# Patient Record
Sex: Female | Born: 1950 | Race: White | Hispanic: No | Marital: Married | Smoking: Never smoker
Health system: Southern US, Community
[De-identification: ages and names within clinical notes are randomized; demographics above are authoritative.]

## PROBLEM LIST (undated history)

## (undated) DIAGNOSIS — Z87442 Personal history of urinary calculi: Secondary | ICD-10-CM

## (undated) DIAGNOSIS — T8859XA Other complications of anesthesia, initial encounter: Secondary | ICD-10-CM

## (undated) DIAGNOSIS — E162 Hypoglycemia, unspecified: Secondary | ICD-10-CM

## (undated) DIAGNOSIS — Z9889 Other specified postprocedural states: Secondary | ICD-10-CM

## (undated) DIAGNOSIS — K219 Gastro-esophageal reflux disease without esophagitis: Secondary | ICD-10-CM

## (undated) DIAGNOSIS — R519 Headache, unspecified: Secondary | ICD-10-CM

## (undated) DIAGNOSIS — D649 Anemia, unspecified: Secondary | ICD-10-CM

## (undated) DIAGNOSIS — C4490 Unspecified malignant neoplasm of skin, unspecified: Secondary | ICD-10-CM

## (undated) DIAGNOSIS — I341 Nonrheumatic mitral (valve) prolapse: Secondary | ICD-10-CM

## (undated) DIAGNOSIS — E785 Hyperlipidemia, unspecified: Secondary | ICD-10-CM

## (undated) DIAGNOSIS — T4145XA Adverse effect of unspecified anesthetic, initial encounter: Secondary | ICD-10-CM

## (undated) DIAGNOSIS — F329 Major depressive disorder, single episode, unspecified: Secondary | ICD-10-CM

## (undated) DIAGNOSIS — N189 Chronic kidney disease, unspecified: Secondary | ICD-10-CM

## (undated) DIAGNOSIS — M1990 Unspecified osteoarthritis, unspecified site: Secondary | ICD-10-CM

## (undated) DIAGNOSIS — R51 Headache: Secondary | ICD-10-CM

## (undated) DIAGNOSIS — F32A Depression, unspecified: Secondary | ICD-10-CM

## (undated) DIAGNOSIS — F419 Anxiety disorder, unspecified: Secondary | ICD-10-CM

## (undated) HISTORY — PX: CARPAL TUNNEL RELEASE: SHX101

## (undated) HISTORY — PX: SKIN TAG REMOVAL: SHX780

## (undated) HISTORY — PX: BUNIONECTOMY: SHX129

## (undated) HISTORY — PX: BELPHAROPTOSIS REPAIR: SHX369

---

## 1974-06-09 HISTORY — PX: BREAST REDUCTION SURGERY: SHX8

## 1998-02-28 ENCOUNTER — Ambulatory Visit (HOSPITAL_COMMUNITY): Admission: RE | Admit: 1998-02-28 | Discharge: 1998-02-28 | Payer: Self-pay | Admitting: Urology

## 2000-04-02 ENCOUNTER — Encounter: Admission: RE | Admit: 2000-04-02 | Discharge: 2000-04-02 | Payer: Self-pay | Admitting: Urology

## 2000-04-02 ENCOUNTER — Encounter: Payer: Self-pay | Admitting: Urology

## 2000-04-10 ENCOUNTER — Other Ambulatory Visit: Admission: RE | Admit: 2000-04-10 | Discharge: 2000-04-10 | Payer: Self-pay | Admitting: *Deleted

## 2000-08-19 ENCOUNTER — Encounter: Payer: Self-pay | Admitting: Emergency Medicine

## 2000-08-19 ENCOUNTER — Emergency Department (HOSPITAL_COMMUNITY): Admission: EM | Admit: 2000-08-19 | Discharge: 2000-08-19 | Payer: Self-pay | Admitting: Emergency Medicine

## 2001-05-28 ENCOUNTER — Encounter: Payer: Self-pay | Admitting: *Deleted

## 2001-05-28 ENCOUNTER — Encounter: Admission: RE | Admit: 2001-05-28 | Discharge: 2001-05-28 | Payer: Self-pay | Admitting: *Deleted

## 2002-05-03 ENCOUNTER — Encounter: Admission: RE | Admit: 2002-05-03 | Discharge: 2002-08-01 | Payer: Self-pay | Admitting: Family Medicine

## 2002-06-20 ENCOUNTER — Other Ambulatory Visit: Admission: RE | Admit: 2002-06-20 | Discharge: 2002-06-20 | Payer: Self-pay | Admitting: *Deleted

## 2002-11-15 ENCOUNTER — Encounter: Admission: RE | Admit: 2002-11-15 | Discharge: 2002-11-15 | Payer: Self-pay | Admitting: Orthopaedic Surgery

## 2002-11-15 ENCOUNTER — Encounter: Payer: Self-pay | Admitting: Orthopaedic Surgery

## 2002-11-15 ENCOUNTER — Encounter: Payer: Self-pay | Admitting: Diagnostic Radiology

## 2002-11-30 ENCOUNTER — Encounter: Admission: RE | Admit: 2002-11-30 | Discharge: 2002-11-30 | Payer: Self-pay | Admitting: Orthopaedic Surgery

## 2002-11-30 ENCOUNTER — Encounter: Payer: Self-pay | Admitting: Orthopaedic Surgery

## 2003-03-02 ENCOUNTER — Ambulatory Visit (HOSPITAL_COMMUNITY): Admission: RE | Admit: 2003-03-02 | Discharge: 2003-03-02 | Payer: Self-pay | Admitting: Urology

## 2003-03-03 ENCOUNTER — Encounter: Payer: Self-pay | Admitting: Emergency Medicine

## 2003-03-03 ENCOUNTER — Emergency Department (HOSPITAL_COMMUNITY): Admission: EM | Admit: 2003-03-03 | Discharge: 2003-03-04 | Payer: Self-pay | Admitting: Emergency Medicine

## 2005-06-05 ENCOUNTER — Ambulatory Visit (HOSPITAL_COMMUNITY): Admission: RE | Admit: 2005-06-05 | Discharge: 2005-06-05 | Payer: Self-pay | Admitting: Chiropractic Medicine

## 2005-07-14 ENCOUNTER — Encounter: Admission: RE | Admit: 2005-07-14 | Discharge: 2005-07-14 | Payer: Self-pay | Admitting: Orthopaedic Surgery

## 2005-08-16 ENCOUNTER — Encounter: Admission: RE | Admit: 2005-08-16 | Discharge: 2005-08-16 | Payer: Self-pay | Admitting: Orthopaedic Surgery

## 2005-10-08 ENCOUNTER — Encounter: Payer: Self-pay | Admitting: Urology

## 2007-06-02 ENCOUNTER — Ambulatory Visit (HOSPITAL_BASED_OUTPATIENT_CLINIC_OR_DEPARTMENT_OTHER): Admission: RE | Admit: 2007-06-02 | Discharge: 2007-06-02 | Payer: Self-pay | Admitting: Orthopedic Surgery

## 2007-12-19 ENCOUNTER — Emergency Department (HOSPITAL_COMMUNITY): Admission: EM | Admit: 2007-12-19 | Discharge: 2007-12-19 | Payer: Self-pay | Admitting: Emergency Medicine

## 2008-01-13 ENCOUNTER — Ambulatory Visit (HOSPITAL_COMMUNITY): Admission: RE | Admit: 2008-01-13 | Discharge: 2008-01-13 | Payer: Self-pay | Admitting: Urology

## 2009-05-31 ENCOUNTER — Encounter (INDEPENDENT_AMBULATORY_CARE_PROVIDER_SITE_OTHER): Payer: Self-pay | Admitting: *Deleted

## 2009-06-09 HISTORY — PX: SQUAMOUS CELL CARCINOMA EXCISION: SHX2433

## 2009-08-24 ENCOUNTER — Telehealth: Payer: Self-pay | Admitting: Internal Medicine

## 2009-09-19 ENCOUNTER — Encounter (INDEPENDENT_AMBULATORY_CARE_PROVIDER_SITE_OTHER): Payer: Self-pay | Admitting: *Deleted

## 2009-10-05 ENCOUNTER — Encounter (INDEPENDENT_AMBULATORY_CARE_PROVIDER_SITE_OTHER): Payer: Self-pay | Admitting: *Deleted

## 2009-10-10 ENCOUNTER — Ambulatory Visit: Payer: Self-pay | Admitting: Internal Medicine

## 2010-04-01 ENCOUNTER — Ambulatory Visit: Payer: Self-pay | Admitting: Cardiology

## 2010-07-09 NOTE — Letter (Signed)
Summary: [facility] Instructions  [facility]  [address] [place], [place] [address]   Phone: [phone]  Fax: [phone]       Mary Harrell    02-04-1951    MRN: [id number]        Procedure Day Dorna Bloom:  [facility] 12/04/09     Arrival Time:  8:00AM     Procedure Time:  9:00AM     Location of Procedure:                    Juliann Pares  [facility] (4th Floor)                        PREPARATION FOR COLONOSCOPY WITH MOVIPREP   Starting 5 days prior to your procedure 11/29/09 do not eat nuts, seeds, popcorn, corn, beans, peas,  salads, or any raw vegetables.  Do not take any fiber supplements (e.g. Metamucil, Citrucel, and Benefiber).  THE DAY BEFORE YOUR PROCEDURE         DATE: 12/03/09  DAY: MONDAY  1.  Drink clear liquids the entire day-NO SOLID FOOD  2.  Do not drink anything colored red or purple.  Avoid juices with pulp.  No orange juice.  3.  Drink at least 64 oz. (8 glasses) of fluid/clear liquids during the day to prevent dehydration and help the prep work efficiently.  CLEAR LIQUIDS INCLUDE: Water Jello Ice Popsicles Tea (sugar ok, no milk/cream) Powdered fruit flavored drinks Coffee (sugar ok, no milk/cream) Gatorade Juice: apple, white grape, white cranberry  Lemonade Clear bullion, consomm, broth Carbonated beverages (any kind) Strained chicken noodle soup Hard Candy                             4.  In the morning, mix first dose of MoviPrep solution:    Empty 1 Pouch A and 1 Pouch B into the disposable container    Add lukewarm drinking water to the top line of the container. Mix to dissolve    Refrigerate (mixed solution should be used within 24 hrs)  5.  Begin drinking the prep at 5:00 p.m. The MoviPrep container is divided by 4 marks.   Every 15 minutes drink the solution down to the next mark (approximately 8 oz) until the full liter is complete.   6.  Follow completed prep with 16 oz of clear liquid of your choice (Nothing  red or purple).  Continue to drink clear liquids until bedtime.  7.  Before going to bed, mix second dose of MoviPrep solution:    Empty 1 Pouch A and 1 Pouch B into the disposable container    Add lukewarm drinking water to the top line of the container. Mix to dissolve    Refrigerate  THE DAY OF YOUR PROCEDURE      DATE: 12/04/09  DAY: TUESDAY  Beginning at 4:00AM (5 hours before procedure):         1. Every 15 minutes, drink the solution down to the next mark (approx 8 oz) until the full liter is complete.  2. Follow completed prep with 16 oz. of clear liquid of your choice.    3. You may drink clear liquids until7:00AM (2 HOURS BEFORE PROCEDURE).   MEDICATION INSTRUCTIONS  Unless otherwise instructed, you should take regular prescription medications with a small sip of water   as early as possible the morning of your  procedure.         OTHER INSTRUCTIONS  You will need a responsible adult at least 62 years of age to accompany you and drive you home.   This person must remain in the waiting room during your procedure.  Wear loose fitting clothing that is easily removed.  Leave jewelry and other valuables at home.  However, you may wish to bring a book to read or  an iPod/MP3 player to listen to music as you wait for your procedure to start.  Remove all body piercing jewelry and leave at home.  Total time from sign-in until discharge is approximately 2-3 hours.  You should go home directly after your procedure and rest.  You can resume normal activities the  day after your procedure.  The day of your procedure you should not:   Drive   Make legal decisions   Operate machinery   Drink alcohol   Return to work  You will receive specific instructions about eating, activities and medications before you leave.    The above instructions have been reviewed and explained to me by  Wyona Almas RN  Oct 10, 2009 8:31 AM     I fully understand and can  verbalize these instructions _____________________________ Date _________   Appended Document: Moviprep Instructions Procedure day is on Tuesday 12/04/09

## 2010-07-09 NOTE — Progress Notes (Signed)
Summary: Schedule Colonoscopy  Phone Note Outgoing Call Call back at [facility] Phone [phone] Call back at [phone]   Call placed by: Harlow Mares CMA Duncan Dull),  August 24, 2009 9:14 AM Call placed to: Patient Summary of Call: Patient due for colonsocopy, patient wants to check with her primary care MD before she schedules her colonoscopy.  Initial call taken by: Harlow Mares CMA Duncan Dull),  August 24, 2009 9:17 AM

## 2010-07-09 NOTE — Letter (Signed)
Summary: Previsit letter  [facility]  [address] [place], [place] [address]   Phone: [phone]  Fax: [phone]       09/19/2009 MRN: [id number]  Mary Harrell [address] [place], [place]  [address]  [place]  Dear Ms. Cumberledge,  Welcome to the Gastroenterology Division at [facility]    You are scheduled to see a nurse for your pre-procedure visit on 10/10/2009 at 8:00AM on the 3rd floor at [facility], [address]  We ask that you try to arrive at our office 15 minutes prior to your appointment time to allow for check-in.  Your nurse visit will consist of discussing your medical and surgical history, your immediate family medical history, and your medications.    Please bring a complete list of all your medications or, if you prefer, bring the medication bottles and we will list them.  We will need to be aware of both prescribed and over the counter drugs.  We will need to know exact dosage information as well.  If you are on blood thinners (Coumadin, Plavix, Aggrenox, Ticlid, etc.) please call our office today/prior to your appointment, as we need to consult with your physician about holding your medication.   Please be prepared to read and sign documents such as consent forms, a financial agreement, and acknowledgement forms.  If necessary, and with your consent, a friend or relative is welcome to sit-in on the nurse visit with you.  Please bring your insurance card so that we may make a copy of it.  If your insurance requires a referral to see a specialist, please bring your referral form from your primary care physician.  No co-pay is required for this nurse visit.     If you cannot keep your appointment, please call [phone] to cancel or reschedule prior to your appointment date.  This allows [place] the opportunity to schedule an appointment for another patient in need of care.    Thank you for choosing [facility] for your medical needs.   We appreciate the opportunity to care for you.  Please visit [place] at our website  to learn more about our practice.                     Sincerely.                                                                                                                   The Gastroenterology Division

## 2010-07-09 NOTE — Miscellaneous (Signed)
Summary: [facility] PREVISIT/PREP  Clinical Lists Changes  Medications: Added new medication of MOVIPREP 100 GM  SOLR (PEG-KCL-NACL-NASULF-NA ASC-C) As per prep instructions. - Signed Rx of MOVIPREP 100 GM  SOLR (PEG-KCL-NACL-NASULF-NA ASC-C) As per prep instructions.;  #1 x 0;  Signed;  Entered by: Wyona Almas RN;  Authorized by: Hilarie Fredrickson MD;  Method used: Electronically to [id number] [address], [place], [place]  [address], Ph: [phone] or [phone], Fax: [phone] Allergies: Added new allergy or adverse reaction of MORPHINE Observations: Added new observation of NKA: F (10/10/2009 7:46)    Prescriptions: MOVIPREP 100 GM  SOLR (PEG-KCL-NACL-NASULF-NA ASC-C) As per prep instructions.  #1 x 0   Entered by:   Wyona Almas RN   Authorized by:   Hilarie Fredrickson MD   Signed by:   Wyona Almas RN on 10/10/2009   Method used:   Electronically to        [id number] (retail)       [address]       [place], [place]  [address]       Ph: [phone] or [phone]       Fax: [phone]   RxID:   [id number]

## 2010-07-16 ENCOUNTER — Other Ambulatory Visit (INDEPENDENT_AMBULATORY_CARE_PROVIDER_SITE_OTHER): Payer: 59

## 2010-07-16 DIAGNOSIS — R0989 Other specified symptoms and signs involving the circulatory and respiratory systems: Secondary | ICD-10-CM

## 2010-10-14 ENCOUNTER — Other Ambulatory Visit: Payer: Self-pay | Admitting: Family Medicine

## 2010-10-14 DIAGNOSIS — M5106 Intervertebral disc disorders with myelopathy, lumbar region: Secondary | ICD-10-CM

## 2010-10-19 ENCOUNTER — Ambulatory Visit
Admission: RE | Admit: 2010-10-19 | Discharge: 2010-10-19 | Disposition: A | Discharge status: Home / Self Care | Payer: 59 | Source: Ambulatory Visit | Attending: Family Medicine | Admitting: Family Medicine

## 2010-10-19 DIAGNOSIS — M5106 Intervertebral disc disorders with myelopathy, lumbar region: Secondary | ICD-10-CM

## 2010-10-22 NOTE — Op Note (Signed)
NAME:  Mary Harrell, Mary Harrell                  ACCOUNT NO.:  [id number]   MEDICAL RECORD NO.:  [id number]          PATIENT TYPE:  AMB   LOCATION:  [facility]                          FACILITY:  [facility]   PHYSICIAN:  Matthew A. Weingold, M.D.DATE OF BIRTH:  13-Apr-1951   DATE OF PROCEDURE:  06/02/2007  DATE OF DISCHARGE:                               OPERATIVE REPORT   PREOPERATIVE DIAGNOSIS:  Chronic right carpal tunnel syndrome.   POSTOPERATIVE DIAGNOSIS:  Chronic right carpal tunnel syndrome.   PROCEDURE:  Right carpal tunnel release.   SURGEON:  Artist Pais. Mina Marble, M.D.   ASSISTANT:  None.   ANESTHESIA:  Regional.   TOURNIQUET TIME:  25 minutes.   COMPLICATIONS:  None.   DRAINS:  None.   OPERATIVE REPORT:  The patient taken to the operating suite.  After the  induction of adequate regional anesthetic, the right upper extremity was  prepped and draped in the usual sterile fashion.  Once this was done a 2-  cm incision was made in the palmar aspect of the right hand in line with  the long finger metacarpal starting at Kaplan's cardinal line.  The skin  was incised.  The palmar fascia was identified and split.  The distal  edge of the transverse carpal ligament was identified and split with a  15 blade.  The median nerve was identified, protected with a Market researcher.  The remaining aspect of the transverse carpal ligament then  divided under direct vision using a curved blunt scissors.  The canal  was inspected.  There were osseous lesions or ganglions present.  It was  irrigated and loosely closed with a 3-0 Prolene subcuticular stitch.  Steri-Strips, 4x4s, fluffs and a compressive dressing were applied.  The  patient tolerated the procedure well and went to the recovery room in  stable fashion.      Artist Pais Mina Marble, M.D.  Electronically Signed     MAW/MEDQ  D:  06/02/2007  T:  06/02/2007  Job:  [id number]

## 2011-02-04 ENCOUNTER — Ambulatory Visit
Admission: RE | Admit: 2011-02-04 | Discharge: 2011-02-04 | Disposition: A | Discharge status: Home / Self Care | Payer: 59 | Source: Ambulatory Visit | Attending: Neurosurgery | Admitting: Neurosurgery

## 2011-02-04 ENCOUNTER — Other Ambulatory Visit: Payer: Self-pay | Admitting: Neurosurgery

## 2011-02-04 DIAGNOSIS — M25559 Pain in unspecified hip: Secondary | ICD-10-CM

## 2011-02-04 DIAGNOSIS — M545 Low back pain: Secondary | ICD-10-CM

## 2011-03-06 LAB — POCT I-STAT, CHEM 8
Chloride: 109
Creatinine, Ser: 1
Glucose, Bld: 151 — ABNORMAL HIGH
Potassium: 3.5

## 2011-03-06 LAB — URINALYSIS, ROUTINE W REFLEX MICROSCOPIC
Bilirubin Urine: NEGATIVE
Nitrite: NEGATIVE
Protein, ur: 30 — AB
Urobilinogen, UA: 0.2

## 2011-03-07 LAB — URINALYSIS, ROUTINE W REFLEX MICROSCOPIC
Bilirubin Urine: NEGATIVE
Ketones, ur: NEGATIVE
Protein, ur: NEGATIVE
Urobilinogen, UA: 0.2

## 2011-03-07 LAB — CBC
HCT: 43.4
Hemoglobin: 15
MCHC: 34.6
MCV: 92
RDW: 12.2

## 2011-03-07 LAB — URINE MICROSCOPIC-ADD ON

## 2011-03-07 LAB — BASIC METABOLIC PANEL
CO2: 29
Calcium: 9.2
Chloride: 104
GFR calc Af Amer: >60
Sodium: 140

## 2011-03-14 LAB — POCT HEMOGLOBIN-HEMACUE
Hemoglobin: 14.1
Operator id: 128471

## 2011-05-28 ENCOUNTER — Ambulatory Visit: Payer: 59

## 2011-05-28 DIAGNOSIS — Z006 Encounter for examination for normal comparison and control in clinical research program: Secondary | ICD-10-CM

## 2013-10-13 ENCOUNTER — Other Ambulatory Visit: Payer: Self-pay | Admitting: Family Medicine

## 2013-10-13 DIAGNOSIS — Z8249 Family history of ischemic heart disease and other diseases of the circulatory system: Secondary | ICD-10-CM

## 2013-11-06 ENCOUNTER — Emergency Department (INDEPENDENT_AMBULATORY_CARE_PROVIDER_SITE_OTHER)
Admission: EM | Admit: 2013-11-06 | Discharge: 2013-11-06 | Disposition: A | Discharge status: Home / Self Care | Payer: 59 | Source: Home / Self Care | Attending: Family Medicine | Admitting: Family Medicine

## 2013-11-06 ENCOUNTER — Encounter (HOSPITAL_COMMUNITY): Payer: Self-pay | Admitting: Emergency Medicine

## 2013-11-06 ENCOUNTER — Emergency Department (INDEPENDENT_AMBULATORY_CARE_PROVIDER_SITE_OTHER): Payer: 59

## 2013-11-06 DIAGNOSIS — M7020 Olecranon bursitis, unspecified elbow: Secondary | ICD-10-CM

## 2013-11-06 DIAGNOSIS — M7022 Olecranon bursitis, left elbow: Secondary | ICD-10-CM

## 2013-11-06 MED ORDER — DICLOFENAC SODIUM 50 MG PO TBEC: 50.0000 mg | DELAYED_RELEASE_TABLET | Freq: Two times a day (BID) | ORAL | Status: DC | PRN

## 2013-11-06 NOTE — ED Provider Notes (Signed)
Mary Harrell is a 63 y.o. female who presents to Urgent Care today for left elbow pain and swelling. Patient hit her elbow on a firm object about 24 hours ago. She notes swelling of the olecranon starting this morning. She notes mild pain. No fevers or chills nausea vomiting or diarrhea. She's tried ice and elevation which helps. She currently taking prednisone for cervical radiculopathy.   History reviewed. No pertinent past medical history. History  Substance Use Topics  . Smoking status: Not on file  . Smokeless tobacco: Not on file  . Alcohol Use: Not on file   ROS as above Medications: No current facility-administered medications for this encounter.   Current Outpatient Prescriptions  Medication Sig Dispense Refill  . diclofenac (VOLTAREN) 50 MG EC tablet Take 1 tablet (50 mg total) by mouth 2 (two) times daily as needed.  60 tablet  0    Exam:  BP 143/79  Pulse 65  Temp(Src) 98.1 F (36.7 C) (Oral)  Resp 18  SpO2 100% Gen: Well NAD Left elbow ecchymosis and swelling of the olecranon. Nontender. Elbow motion is intact. Capillary refill sensation and pulses are intact distally. Hand motion is intact distally.  No results found for this or any previous visit (from the past 24 hour(s)). Dg Elbow Complete Left  11/06/2013   CLINICAL DATA:  Left elbow pain following injury  EXAM: LEFT ELBOW - COMPLETE 3+ VIEW  COMPARISON:  None.  FINDINGS: No acute fracture is identified. No joint effusion is seen. Mild degenerative changes of the articulation between the humerus and ulna are seen. No soft tissue changes are noted.  IMPRESSION: No acute abnormality seen.   Electronically Signed   By: Inez Catalina M.D.   On: 11/06/2013 13:53    Assessment and Plan: 63 y.o. female with traumatic olecranon bursitis left elbow. Ice rest elevation and Voltaren. Elbow pad.  Discussed warning signs or symptoms. Please see discharge instructions. Patient expresses understanding.    Gregor Hams,  MD 11/06/13 [phone]

## 2013-11-06 NOTE — Discharge Instructions (Signed)
Thank you for coming in today. Keep the elbow protected.  Use the pad as needed.    Olecranon Bursitis  with Rehab A bursa is a fluid filled sac that is located between soft tissues (ligaments, tendons, skin) and bones. The purpose of a bursa is to allow the soft tissue to function smoothly, without friction. The olecrenon bursa is located between the back of the elbow (olecrenon) and the skin. Olecrenon bursitis involves inflammation of this bursa, resulting in pain. SYMPTOMS   Pain, tenderness, swelling, warmth, or redness over the back of the elbow.  Reduced range of motion of the affected elbow.  Sometimes, severe pain with movement of the affected elbow.  Crackling sound (crepitation) when the bursa is moved or touched.  Often, painless swelling of the bursa.  Fever (when infected). CAUSES  Olecranon bursitis is often caused by direct hit (trauma) to the elbow. Less commonly, it is due to overuse and/or strenuous exercise that the elbow is not used to. RISK INCREASES WITH:  Sports that require bending or landing on the elbow (football, volleyball).  Vigorous or repetitive athletic training, or sudden increase or change in activity level (weekend warriors).  Failure to warm up properly before activity.  Poor exercise technique.  Playing on artificial turf. PREVENTION  Avoid injuries and the overuse of muscles whenever possible.  Warm up and stretch properly before activity.  Allow for adequate recovery between workouts.  Maintain physical fitness:  Strength, flexibility, and endurance.  Cardiovascular fitness.  Learn and use proper technique.  Wear properly fitted and padded protective equipment. PROGNOSIS  If treated properly, olecranon bursitis is usually curable within 2 weeks.  RELATED COMPLICATIONS   Longer healing time, if not properly treated or if not given enough time to heal.  Recurring symptoms that result in a chronic problem.  Joint  stiffness with permanent limitation of the affected joint's movement.  Infection of the bursa.  Chronic inflammation or scarring of the bursa. TREATMENT Treatment first involves the use of ice and medicine, to reduce pain and inflammation. The use of strengthening and stretching exercises may help reduce pain with activity. These exercises may be performed at home or with a therapist. Elbow pads may be advised, to protect the bursa. If symptoms persist, despite non-surgical treatment, a procedure to withdraw fluid from the bursa may be advised. This procedure may be accompanied with an injection of corticosteroids, to reduce inflammation. Sometimes, surgery is needed to remove the bursa. MEDICATION  If pain medicine is needed, nonsteroidal anti-inflammatory medicines (aspirin and ibuprofen), or other minor pain relievers (acetaminophen), are often advised.  Do not take pain medicine for 7 days before surgery.  Prescription pain relievers may be given, if your caregiver thinks they are needed. Use only as directed and only as much as you need.  Corticosteroid injections may be given by your caregiver. These injections should be reserved for the most serious cases, because they may only be given a certain number of times. HEAT AND COLD  Cold treatment (icing) should be applied for 10 to 15 minutes every 2 to 3 hours for inflammation and pain, and immediately after activity that aggravates your symptoms. Use ice packs or an ice massage.  Heat treatment may be used before performing stretching and strengthening activities prescribed by your caregiver, physical therapist, or athletic trainer. Use a heat pack or a warm water soak. SEEK IMMEDIATE MEDICAL CARE IF:   Symptoms get worse or do not improve in 2 weeks, despite treatment.  Signs of infection develop, including fever of 102 F (38.9 C), increased pain, redness, warmth, or pus draining from the bursa.  New, unexplained symptoms develop.  (Drugs used in treatment may produce side effects.) EXERCISES  RANGE OF MOTION (ROM) AND STRETCHING EXERCISES - Olecranon Bursitis These exercises may help you when beginning to rehabilitate your injury. Your symptoms may resolve with or without further involvement from your physician, physical therapist or athletic trainer. While completing these exercises, remember:   Restoring tissue flexibility helps normal motion to return to the joints. This allows healthier, less painful movement and activity.  An effective stretch should be held for at least 30 seconds.  A stretch should never be painful. You should only feel a gentle lengthening or release in the stretched tissue. RANGE OF MOTION  Elbow Flexion, Supine  Lie on your back. Extend your right / left arm into the air, bracing it with your opposite hand. Allow your right / left arm to relax.  Let your elbow bend, allowing your hand to fall slowly toward your chest.  You should feel a gentle stretch along the back of your upper arm and elbow. Your physician, physical therapist or athletic trainer may ask you to hold a __________ hand weight to increase the intensity of this stretch.  Hold for __________ seconds. Slowly return your right / left arm to the upright position. Repeat __________ times. Complete this exercise __________ times per day. STRETCH  Elbow Flexors  Lie on a firm bed or countertop on your back. Be sure that you are in a comfortable position which will allow you to relax your arm muscles.  Place a folded towel under your right / left upper arm, so that your elbow and shoulder are at the same height. Extend your arm; your elbow should not rest on the bed or towel  Allow the weight of your hand to straighten your elbow. Keep your arm and chest muscles relaxed. Your caregiver may ask you to increase the intensity of your stretch by adding a small wrist or hand weight.  Hold for __________ seconds. You should feel a  stretch on the inside of your elbow. Slowly return to the starting position. Repeat __________ times. Complete this exercise __________ times per day. STRENGTHENING EXERCISES - Olecranon Bursitis These exercises will help you regain your strength. They may resolve your symptoms with or without further involvement from your physician, physical therapist or athletic trainer. While completing these exercises, remember:   Muscles can gain both the endurance and the strength needed for everyday activities through controlled exercises.  Complete these exercises as instructed by your physician, physical therapist or athletic trainer. Increase the resistance and repetitions only as guided by your caregiver.  You may experience muscle soreness or fatigue, but the pain or discomfort you are trying to eliminate should never worsen during these exercises. If this pain does worsen, stop and make certain you are following the directions exactly. If the pain is still present after adjustments, discontinue the exercise until you can discuss the trouble with your caregiver. STRENGTH - Elbow Extensors, Isometric  Stand or sit upright on a firm surface. Place your right / left arm so that your palm faces your stomach, and it is at the height of your waist.  Place your opposite hand on the underside of your forearm. Gently push up as your right / left arm resists. Push as hard as you can with both arms without causing any pain or movement at your right /  left elbow. Hold this stationary position for __________ seconds.  Gradually release the tension in both arms. Allow your muscles to relax completely before repeating. Repeat __________ times. Complete this exercise __________ times per day. STRENGTH - Elbow Flexors, Isometric  Stand or sit upright on a firm surface. Place your right / left arm so that your hand is palm-up and at the height of your waist.  Place your opposite hand on top of your forearm. Gently  push down as your right / left arm resists. Push as hard as you can with both arms without causing any pain or movement at your right / left elbow. Hold this stationary position for __________ seconds.  Gradually release the tension in both arms. Allow your muscles to relax completely before repeating. Repeat __________ times. Complete this exercise __________ times per day. STRENGTH  Elbow Flexors, Supinated  With good posture, stand or sit on a firm chair without armrests. Allow your right / left arm to rest at your side with your palm facing forward.  Holding a __________ weight, or gripping a rubber exercise band or tubing,  bring your hand toward your shoulder.  Allow your muscles to control the resistance as your hand returns to your side. Repeat __________ times. Complete this exercise __________ times per day.  STRENGTH  Elbow Flexors, Neutral  With good posture, stand or sit on a firm chair without armrests. Allow your right / left arm to rest at your side with your thumb facing forward.  Holding a __________weight, or gripping a rubber exercise band or tubing,  bring your hand toward your shoulder.  Allow your muscles to control the resistance as your hand returns to your side. Repeat __________ times. Complete this exercise __________ times per day.  STRENGTH  Elbow Extensors  Lie on your back. Extend your right / left elbow into the air, pointing it toward the ceiling. Brace your arm with your opposite hand.*  Holding a __________ weight in your hand, slowly straighten your right / left elbow.  Allow your muscles to control the weight as your hand returns to its starting position. Repeat __________ times. Complete this exercise __________ times per day. *You may also stand with your elbow overhead and pointed toward the ceiling, supported by your opposite hand. STRENGTH - Elbow Extensors, Dynamic  With good posture, stand, or sit on a firm chair without armrests.  Keeping your upper arms at your side, bring both hands up to your right / left shoulder while gripping a rubber exercise band or tubing. Your right / left hand should be just below the other hand.  Straighten your right / left elbow. Hold for __________ seconds.  Allow your muscles to control the rubber exercise band, as your hand returns to your shoulder. Repeat __________ times. Complete this exercise __________ times per day. Document Released: 05/26/2005 Document Revised: 08/18/2011 Document Reviewed: 09/07/2008 [facility] Patient Information 2014 [place], [place].

## 2013-11-06 NOTE — ED Notes (Signed)
C/o elbow injury States she hit elbow on rail at target around 11 am yesterday morning States elbow is red and swollen Did ice and is currently on prednisone

## 2013-11-08 ENCOUNTER — Other Ambulatory Visit: Payer: 59

## 2013-11-09 ENCOUNTER — Other Ambulatory Visit: Payer: Self-pay | Admitting: Family Medicine

## 2013-11-09 ENCOUNTER — Ambulatory Visit
Admission: RE | Admit: 2013-11-09 | Discharge: 2013-11-09 | Disposition: A | Discharge status: Home / Self Care | Payer: 59 | Source: Ambulatory Visit | Attending: Family Medicine | Admitting: Family Medicine

## 2013-11-09 DIAGNOSIS — M25512 Pain in left shoulder: Secondary | ICD-10-CM

## 2013-11-09 DIAGNOSIS — Z8249 Family history of ischemic heart disease and other diseases of the circulatory system: Secondary | ICD-10-CM

## 2013-12-21 ENCOUNTER — Other Ambulatory Visit: Payer: Self-pay | Admitting: Family Medicine

## 2013-12-22 ENCOUNTER — Other Ambulatory Visit: Payer: Self-pay | Admitting: Family Medicine

## 2013-12-22 DIAGNOSIS — M25512 Pain in left shoulder: Secondary | ICD-10-CM

## 2013-12-28 ENCOUNTER — Ambulatory Visit
Admission: RE | Admit: 2013-12-28 | Discharge: 2013-12-28 | Disposition: A | Discharge status: Home / Self Care | Payer: 59 | Source: Ambulatory Visit | Attending: Family Medicine | Admitting: Family Medicine

## 2013-12-28 DIAGNOSIS — M25512 Pain in left shoulder: Secondary | ICD-10-CM

## 2014-01-26 ENCOUNTER — Other Ambulatory Visit: Payer: Self-pay | Admitting: Neurosurgery

## 2014-01-27 ENCOUNTER — Other Ambulatory Visit: Payer: Self-pay | Admitting: Neurosurgery

## 2014-01-27 DIAGNOSIS — M5010 Cervical disc disorder with radiculopathy, unspecified cervical region: Secondary | ICD-10-CM

## 2014-01-31 ENCOUNTER — Ambulatory Visit
Admission: RE | Admit: 2014-01-31 | Discharge: 2014-01-31 | Disposition: A | Discharge status: Home / Self Care | Payer: 59 | Source: Ambulatory Visit | Attending: Neurosurgery | Admitting: Neurosurgery

## 2014-01-31 VITALS — BP 121/70 | HR 72

## 2014-01-31 DIAGNOSIS — M5020 Other cervical disc displacement, unspecified cervical region: Secondary | ICD-10-CM

## 2014-01-31 DIAGNOSIS — M5010 Cervical disc disorder with radiculopathy, unspecified cervical region: Secondary | ICD-10-CM

## 2014-01-31 MED ORDER — IOHEXOL 300 MG/ML  SOLN
1.0000 mL | Freq: Once | INTRAMUSCULAR | Status: AC | PRN
  Administered 2014-01-31: 1 mL via EPIDURAL

## 2014-01-31 MED ORDER — TRIAMCINOLONE ACETONIDE 40 MG/ML IJ SUSP (RADIOLOGY)
60.0000 mg | Freq: Once | INTRAMUSCULAR | Status: AC
  Administered 2014-01-31: 60 mg via EPIDURAL

## 2014-01-31 NOTE — Discharge Instructions (Signed)

## 2014-05-25 ENCOUNTER — Other Ambulatory Visit: Payer: Self-pay | Admitting: Neurosurgery

## 2014-05-25 NOTE — Pre-Procedure Instructions (Signed)
Mary Harrell  05/25/2014   Your procedure is scheduled on:  Monday, December 28th  Report to [facility] Admitting at 530 AM.  Call this number if you have problems the morning of surgery: [phone]   Remember:   Do not eat food or drink liquids after midnight.   Take these medicines the morning of surgery with A SIP OF WATER: none   Do not wear jewelry, make-up or nail polish.  Do not wear lotions, powders, or perfume, deodorant.  Do not shave 48 hours prior to surgery. Men may shave face and neck.  Do not bring valuables to the hospital.  [facility] is not responsible  for any belongings or valuables.               Contacts, dentures or bridgework may not be worn into surgery.  Leave suitcase in the car. After surgery it may be brought to your room.  For patients admitted to the hospital, discharge time is determined by your treatment team.               Patients discharged the day of surgery will not be allowed to drive home.  Please read over the following fact sheets that you were given: Pain Booklet, Coughing and Deep Breathing, MRSA Information and Surgical Site Infection Prevention  [place] - Preparing for Surgery  Before surgery, you can play an important role.  Because skin is not sterile, your skin needs to be as free of germs as possible.  You can reduce the number of germs on you skin by washing with CHG (chlorahexidine gluconate) soap before surgery.  CHG is an antiseptic cleaner which kills germs and bonds with the skin to continue killing germs even after washing.  Please DO NOT use if you have an allergy to CHG or antibacterial soaps.  If your skin becomes reddened/irritated stop using the CHG and inform your nurse when you arrive at Short Stay.  Do not shave (including legs and underarms) for at least 48 hours prior to the first CHG shower.  You may shave your face.  Please follow these instructions carefully:   1.  Shower with CHG Soap the night  before surgery and the morning of Surgery.  2.  If you choose to wash your hair, wash your hair first as usual with your normal shampoo.  3.  After you shampoo, rinse your hair and body thoroughly to remove the shampoo.  4.  Use CHG as you would any other liquid soap.  You can apply CHG directly to the skin and wash gently with scrungie or a clean washcloth.  5.  Apply the CHG Soap to your body ONLY FROM THE NECK DOWN.  Do not use on open wounds or open sores.  Avoid contact with your eyes, ears, mouth and genitals (private parts).  Wash genitals (private parts) with your normal soap.  6.  Wash thoroughly, paying special attention to the area where your surgery will be performed.  7.  Thoroughly rinse your body with warm water from the neck down.  8.  DO NOT shower/wash with your normal soap after using and rinsing off the CHG Soap.  9.  Pat yourself dry with a clean towel.            10.  Wear clean pajamas.            11.  Place clean sheets on your bed the night of your first shower and  do not sleep with pets.  Day of Surgery  Do not apply any lotions/deoderants the morning of surgery.  Please wear clean clothes to the hospital/surgery center.

## 2014-05-26 ENCOUNTER — Encounter (HOSPITAL_COMMUNITY): Payer: Self-pay

## 2014-05-26 ENCOUNTER — Encounter (HOSPITAL_COMMUNITY)
Admission: RE | Admit: 2014-05-26 | Discharge: 2014-05-26 | Disposition: A | Discharge status: Home / Self Care | Payer: Medicare Other | Source: Ambulatory Visit | Attending: Neurosurgery | Admitting: Neurosurgery

## 2014-05-26 DIAGNOSIS — Z01818 Encounter for other preprocedural examination: Secondary | ICD-10-CM | POA: Diagnosis not present

## 2014-05-26 DIAGNOSIS — I341 Nonrheumatic mitral (valve) prolapse: Secondary | ICD-10-CM | POA: Diagnosis not present

## 2014-05-26 HISTORY — DX: Depression, unspecified: F32.A

## 2014-05-26 HISTORY — DX: Personal history of urinary calculi: Z87.442

## 2014-05-26 HISTORY — DX: Headache: R51

## 2014-05-26 HISTORY — DX: Headache, unspecified: R51.9

## 2014-05-26 HISTORY — DX: Nonrheumatic mitral (valve) prolapse: I34.1

## 2014-05-26 HISTORY — DX: Gastro-esophageal reflux disease without esophagitis: K21.9

## 2014-05-26 HISTORY — DX: Unspecified malignant neoplasm of skin, unspecified: C44.90

## 2014-05-26 HISTORY — DX: Unspecified osteoarthritis, unspecified site: M19.90

## 2014-05-26 HISTORY — DX: Hypoglycemia, unspecified: E16.2

## 2014-05-26 HISTORY — DX: Hyperlipidemia, unspecified: E78.5

## 2014-05-26 HISTORY — DX: Anxiety disorder, unspecified: F41.9

## 2014-05-26 HISTORY — DX: Adverse effect of unspecified anesthetic, initial encounter: T41.45XA

## 2014-05-26 HISTORY — DX: Other complications of anesthesia, initial encounter: T88.59XA

## 2014-05-26 HISTORY — DX: Major depressive disorder, single episode, unspecified: F32.9

## 2014-05-26 LAB — SURGICAL PCR SCREEN
MRSA, PCR: NEGATIVE
Staphylococcus aureus: NEGATIVE

## 2014-05-26 LAB — CBC
HCT: 41.2 % (ref 36.0–46.0)
Hemoglobin: 13.9 g/dL (ref 12.0–15.0)
MCH: 30.8 pg (ref 26.0–34.0)
MCHC: 33.7 g/dL (ref 30.0–36.0)
MCV: 91.2 fL (ref 78.0–100.0)
PLATELETS: 171 10*3/uL (ref 150–400)
RBC: 4.52 MIL/uL (ref 3.87–5.11)
RDW: 12.2 % (ref 11.5–15.5)
WBC: 6.5 10*3/uL (ref 4.0–10.5)

## 2014-05-26 NOTE — Progress Notes (Signed)
PCP is [name]. Patient informed Nurse that she had a EKG done with PCP this year. Will request records. Patient also informed Nurse that she has a mitral valve prolapse, but denied having any recent echocardiograms or cardiac studies. Patient denied having any chest pain, discomfort, or shortness of breath. Patient stated that she would like to talk with a Anesthesiologist about prior surgeries and anesthesia staying in system for six months afterwards. [name], [place] does not arrive until 1000 therefore Nurse called Dr. Oletta Lamas and informed her of this. Dr. Oletta Lamas stated she would come and see patient.

## 2014-05-29 DIAGNOSIS — R198 Other specified symptoms and signs involving the digestive system and abdomen: Secondary | ICD-10-CM | POA: Diagnosis not present

## 2014-06-05 ENCOUNTER — Inpatient Hospital Stay (HOSPITAL_COMMUNITY): Payer: Medicare Other

## 2014-06-05 ENCOUNTER — Encounter (HOSPITAL_COMMUNITY)
Admission: RE | Disposition: A | Discharge status: Home / Self Care | Payer: Self-pay | Source: Ambulatory Visit | Attending: Neurosurgery

## 2014-06-05 ENCOUNTER — Inpatient Hospital Stay (HOSPITAL_COMMUNITY): Payer: Medicare Other | Admitting: Certified Registered"

## 2014-06-05 ENCOUNTER — Encounter (HOSPITAL_COMMUNITY): Payer: Self-pay | Admitting: Certified Registered"

## 2014-06-05 ENCOUNTER — Inpatient Hospital Stay (HOSPITAL_COMMUNITY)
Admission: RE | Admit: 2014-06-05 | Discharge: 2014-06-06 | DRG: 472 | Disposition: A | Discharge status: Home / Self Care | Payer: Medicare Other | Source: Ambulatory Visit | Attending: Neurosurgery | Admitting: Neurosurgery

## 2014-06-05 DIAGNOSIS — E785 Hyperlipidemia, unspecified: Secondary | ICD-10-CM | POA: Diagnosis present

## 2014-06-05 DIAGNOSIS — M1990 Unspecified osteoarthritis, unspecified site: Secondary | ICD-10-CM | POA: Diagnosis not present

## 2014-06-05 DIAGNOSIS — M47812 Spondylosis without myelopathy or radiculopathy, cervical region: Secondary | ICD-10-CM | POA: Diagnosis not present

## 2014-06-05 DIAGNOSIS — Z7982 Long term (current) use of aspirin: Secondary | ICD-10-CM | POA: Diagnosis not present

## 2014-06-05 DIAGNOSIS — M5031 Other cervical disc degeneration,  high cervical region: Secondary | ICD-10-CM | POA: Diagnosis not present

## 2014-06-05 DIAGNOSIS — G9520 Unspecified cord compression: Secondary | ICD-10-CM | POA: Diagnosis present

## 2014-06-05 DIAGNOSIS — M4802 Spinal stenosis, cervical region: Secondary | ICD-10-CM | POA: Diagnosis present

## 2014-06-05 DIAGNOSIS — I341 Nonrheumatic mitral (valve) prolapse: Secondary | ICD-10-CM | POA: Diagnosis present

## 2014-06-05 DIAGNOSIS — K219 Gastro-esophageal reflux disease without esophagitis: Secondary | ICD-10-CM | POA: Diagnosis present

## 2014-06-05 DIAGNOSIS — Z85828 Personal history of other malignant neoplasm of skin: Secondary | ICD-10-CM

## 2014-06-05 DIAGNOSIS — R110 Nausea: Secondary | ICD-10-CM | POA: Diagnosis not present

## 2014-06-05 DIAGNOSIS — M5032 Other cervical disc degeneration, mid-cervical region: Secondary | ICD-10-CM | POA: Diagnosis not present

## 2014-06-05 DIAGNOSIS — F419 Anxiety disorder, unspecified: Secondary | ICD-10-CM | POA: Diagnosis present

## 2014-06-05 DIAGNOSIS — M542 Cervicalgia: Secondary | ICD-10-CM | POA: Diagnosis not present

## 2014-06-05 DIAGNOSIS — Z981 Arthrodesis status: Secondary | ICD-10-CM | POA: Diagnosis not present

## 2014-06-05 DIAGNOSIS — M47892 Other spondylosis, cervical region: Secondary | ICD-10-CM | POA: Diagnosis not present

## 2014-06-05 DIAGNOSIS — F329 Major depressive disorder, single episode, unspecified: Secondary | ICD-10-CM | POA: Diagnosis present

## 2014-06-05 DIAGNOSIS — M5012 Cervical disc disorder with radiculopathy, mid-cervical region: Secondary | ICD-10-CM | POA: Diagnosis not present

## 2014-06-05 HISTORY — PX: ANTERIOR CERVICAL DECOMP/DISCECTOMY FUSION: SHX1161

## 2014-06-05 SURGERY — ANTERIOR CERVICAL DECOMPRESSION/DISCECTOMY FUSION 3 LEVELS
Anesthesia: General

## 2014-06-05 MED ORDER — SUFENTANIL CITRATE 50 MCG/ML IV SOLN
INTRAVENOUS | Status: AC
  Filled 2014-06-05: qty 1

## 2014-06-05 MED ORDER — ACETAMINOPHEN 325 MG PO TABS: 650.0000 mg | ORAL_TABLET | ORAL | Status: DC | PRN

## 2014-06-05 MED ORDER — CEFAZOLIN SODIUM 1-5 GM-% IV SOLN
1.0000 g | Freq: Three times a day (TID) | INTRAVENOUS | Status: AC
Start: 2014-06-05 — End: 2014-06-05
  Administered 2014-06-05 (×2): 1 g via INTRAVENOUS
  Filled 2014-06-05 (×2): qty 50

## 2014-06-05 MED ORDER — GLYCOPYRROLATE 0.2 MG/ML IJ SOLN
INTRAMUSCULAR | Status: AC
  Filled 2014-06-05: qty 2

## 2014-06-05 MED ORDER — SUFENTANIL CITRATE 50 MCG/ML IV SOLN
INTRAVENOUS | Status: DC | PRN
  Administered 2014-06-05: 5 ug via INTRAVENOUS
  Administered 2014-06-05: 10 ug via INTRAVENOUS
  Administered 2014-06-05: 5 ug via INTRAVENOUS
  Administered 2014-06-05: 20 ug via INTRAVENOUS

## 2014-06-05 MED ORDER — SUMATRIPTAN SUCCINATE 100 MG PO TABS
100.0000 mg | ORAL_TABLET | ORAL | Status: DC | PRN
  Administered 2014-06-05: 100 mg via ORAL
  Filled 2014-06-05 (×4): qty 1

## 2014-06-05 MED ORDER — ACETAMINOPHEN 650 MG RE SUPP: 650.0000 mg | RECTAL | Status: DC | PRN

## 2014-06-05 MED ORDER — ONDANSETRON HCL 4 MG/2ML IJ SOLN
INTRAMUSCULAR | Status: AC
  Filled 2014-06-05: qty 2

## 2014-06-05 MED ORDER — CEFAZOLIN SODIUM-DEXTROSE 2-3 GM-% IV SOLR
INTRAVENOUS | Status: DC | PRN
  Administered 2014-06-05: 2 g via INTRAVENOUS

## 2014-06-05 MED ORDER — DEXAMETHASONE SODIUM PHOSPHATE 10 MG/ML IJ SOLN
INTRAMUSCULAR | Status: DC | PRN
  Administered 2014-06-05: 10 mg via INTRAVENOUS

## 2014-06-05 MED ORDER — EPHEDRINE SULFATE 50 MG/ML IJ SOLN
INTRAMUSCULAR | Status: DC | PRN
  Administered 2014-06-05 (×2): 10 mg via INTRAVENOUS

## 2014-06-05 MED ORDER — ALUM & MAG HYDROXIDE-SIMETH 200-200-20 MG/5ML PO SUSP: 30.0000 mL | Freq: Four times a day (QID) | ORAL | Status: DC | PRN

## 2014-06-05 MED ORDER — PROMETHAZINE HCL 25 MG PO TABS
12.5000 mg | ORAL_TABLET | Freq: Four times a day (QID) | ORAL | Status: DC | PRN
  Administered 2014-06-05 – 2014-06-06 (×2): 12.5 mg via ORAL
  Filled 2014-06-05 (×2): qty 1

## 2014-06-05 MED ORDER — SUCCINYLCHOLINE CHLORIDE 20 MG/ML IJ SOLN
INTRAMUSCULAR | Status: AC
  Filled 2014-06-05: qty 1

## 2014-06-05 MED ORDER — THROMBIN 20000 UNITS EX SOLR
CUTANEOUS | Status: DC | PRN
  Administered 2014-06-05: 09:00:00 via TOPICAL

## 2014-06-05 MED ORDER — LIDOCAINE HCL (CARDIAC) 20 MG/ML IV SOLN
INTRAVENOUS | Status: AC
  Filled 2014-06-05: qty 5

## 2014-06-05 MED ORDER — SODIUM CHLORIDE 0.9 % IV SOLN: 250.0000 mL | INTRAVENOUS | Status: DC

## 2014-06-05 MED ORDER — LACTATED RINGERS IV SOLN
INTRAVENOUS | Status: DC | PRN
  Administered 2014-06-05 (×2): via INTRAVENOUS

## 2014-06-05 MED ORDER — ONDANSETRON HCL 4 MG/2ML IJ SOLN
INTRAMUSCULAR | Status: DC | PRN
  Administered 2014-06-05: 4 mg via INTRAVENOUS

## 2014-06-05 MED ORDER — HYDROMORPHONE HCL 1 MG/ML IJ SOLN
INTRAMUSCULAR | Status: AC
  Filled 2014-06-05: qty 1

## 2014-06-05 MED ORDER — SIMVASTATIN 20 MG PO TABS
20.0000 mg | ORAL_TABLET | Freq: Every day | ORAL | Status: DC
  Administered 2014-06-05 – 2014-06-06 (×2): 20 mg via ORAL
  Filled 2014-06-05 (×2): qty 1

## 2014-06-05 MED ORDER — PROPOFOL 10 MG/ML IV BOLUS
INTRAVENOUS | Status: DC | PRN
  Administered 2014-06-05: 175 mg via INTRAVENOUS

## 2014-06-05 MED ORDER — SODIUM CHLORIDE 0.9 % IJ SOLN: 3.0000 mL | INTRAMUSCULAR | Status: DC | PRN

## 2014-06-05 MED ORDER — SODIUM CHLORIDE 0.9 % IJ SOLN
INTRAMUSCULAR | Status: AC
  Filled 2014-06-05: qty 10

## 2014-06-05 MED ORDER — ROCURONIUM BROMIDE 100 MG/10ML IV SOLN
INTRAVENOUS | Status: DC | PRN
  Administered 2014-06-05: 5 mg via INTRAVENOUS
  Administered 2014-06-05: 30 mg via INTRAVENOUS
  Administered 2014-06-05: 5 mg via INTRAVENOUS
  Administered 2014-06-05: 10 mg via INTRAVENOUS

## 2014-06-05 MED ORDER — ONDANSETRON HCL 4 MG/2ML IJ SOLN: 4.0000 mg | Freq: Once | INTRAMUSCULAR | Status: DC | PRN

## 2014-06-05 MED ORDER — CEFAZOLIN SODIUM-DEXTROSE 2-3 GM-% IV SOLR
INTRAVENOUS | Status: AC
  Filled 2014-06-05: qty 50

## 2014-06-05 MED ORDER — MIDAZOLAM HCL 2 MG/2ML IJ SOLN
INTRAMUSCULAR | Status: AC
  Filled 2014-06-05: qty 2

## 2014-06-05 MED ORDER — LIDOCAINE HCL (CARDIAC) 20 MG/ML IV SOLN
INTRAVENOUS | Status: DC | PRN
  Administered 2014-06-05: 100 mg via INTRAVENOUS

## 2014-06-05 MED ORDER — 0.9 % SODIUM CHLORIDE (POUR BTL) OPTIME
TOPICAL | Status: DC | PRN
  Administered 2014-06-05: 1000 mL

## 2014-06-05 MED ORDER — PROPOFOL 10 MG/ML IV EMUL
INTRAVENOUS | Status: AC
  Administered 2014-06-05: 100 ug/kg/min via INTRAVENOUS
  Filled 2014-06-05: qty 100

## 2014-06-05 MED ORDER — CYCLOBENZAPRINE HCL 10 MG PO TABS
10.0000 mg | ORAL_TABLET | Freq: Three times a day (TID) | ORAL | Status: DC | PRN
  Administered 2014-06-05 – 2014-06-06 (×3): 10 mg via ORAL
  Filled 2014-06-05 (×3): qty 1

## 2014-06-05 MED ORDER — NEOSTIGMINE METHYLSULFATE 10 MG/10ML IV SOLN
INTRAVENOUS | Status: AC
  Filled 2014-06-05: qty 1

## 2014-06-05 MED ORDER — OXYCODONE-ACETAMINOPHEN 5-325 MG PO TABS
1.0000 | ORAL_TABLET | ORAL | Status: DC | PRN
  Administered 2014-06-05 (×2): 2 via ORAL
  Filled 2014-06-05 (×2): qty 2

## 2014-06-05 MED ORDER — HYDROMORPHONE HCL 1 MG/ML IJ SOLN
0.5000 mg | INTRAMUSCULAR | Status: DC | PRN
  Administered 2014-06-05 – 2014-06-06 (×2): 1 mg via INTRAVENOUS
  Filled 2014-06-05 (×2): qty 1

## 2014-06-05 MED ORDER — GLYCOPYRROLATE 0.2 MG/ML IJ SOLN
INTRAMUSCULAR | Status: DC | PRN
  Administered 2014-06-05: 0.4 mg via INTRAVENOUS

## 2014-06-05 MED ORDER — ONDANSETRON HCL 4 MG/2ML IJ SOLN
4.0000 mg | INTRAMUSCULAR | Status: DC | PRN
  Administered 2014-06-05 – 2014-06-06 (×3): 4 mg via INTRAVENOUS
  Filled 2014-06-05 (×3): qty 2

## 2014-06-05 MED ORDER — HYDROMORPHONE HCL 1 MG/ML IJ SOLN
0.2500 mg | INTRAMUSCULAR | Status: DC | PRN
  Administered 2014-06-05 (×3): 0.25 mg via INTRAVENOUS
  Administered 2014-06-05: 0.5 mg via INTRAVENOUS

## 2014-06-05 MED ORDER — SODIUM CHLORIDE 0.9 % IJ SOLN
3.0000 mL | Freq: Two times a day (BID) | INTRAMUSCULAR | Status: DC
  Administered 2014-06-05 – 2014-06-06 (×2): 3 mL via INTRAVENOUS

## 2014-06-05 MED ORDER — PROPOFOL 10 MG/ML IV BOLUS
INTRAVENOUS | Status: AC
  Filled 2014-06-05: qty 20

## 2014-06-05 MED ORDER — THROMBIN 5000 UNITS EX SOLR
OROMUCOSAL | Status: DC | PRN
  Administered 2014-06-05: 09:00:00 via TOPICAL

## 2014-06-05 MED ORDER — SODIUM CHLORIDE 0.9 % IR SOLN
Status: DC | PRN
  Administered 2014-06-05: 09:00:00

## 2014-06-05 MED ORDER — EPHEDRINE SULFATE 50 MG/ML IJ SOLN
INTRAMUSCULAR | Status: AC
  Filled 2014-06-05: qty 1

## 2014-06-05 MED ORDER — DEXAMETHASONE SODIUM PHOSPHATE 10 MG/ML IJ SOLN
INTRAMUSCULAR | Status: AC
  Filled 2014-06-05: qty 1

## 2014-06-05 MED ORDER — MENTHOL 3 MG MT LOZG: 1.0000 | LOZENGE | OROMUCOSAL | Status: DC | PRN

## 2014-06-05 MED ORDER — NEOSTIGMINE METHYLSULFATE 10 MG/10ML IV SOLN
INTRAVENOUS | Status: DC | PRN
Start: 2014-06-05 — End: 2014-06-05
  Administered 2014-06-05: 3 mg via INTRAVENOUS

## 2014-06-05 MED ORDER — ROCURONIUM BROMIDE 50 MG/5ML IV SOLN
INTRAVENOUS | Status: AC
  Filled 2014-06-05: qty 1

## 2014-06-05 MED ORDER — ASPIRIN EC 81 MG PO TBEC
81.0000 mg | DELAYED_RELEASE_TABLET | Freq: Every day | ORAL | Status: DC
  Administered 2014-06-05 – 2014-06-06 (×2): 81 mg via ORAL
  Filled 2014-06-05 (×2): qty 1

## 2014-06-05 MED ORDER — PHENOL 1.4 % MT LIQD: 1.0000 | OROMUCOSAL | Status: DC | PRN

## 2014-06-05 SURGICAL SUPPLY — 65 items
BAG DECANTER FOR FLEXI CONT (MISCELLANEOUS) ×3 IMPLANT
BENZOIN TINCTURE PRP APPL 2/3 (GAUZE/BANDAGES/DRESSINGS) ×3 IMPLANT
BIT DRILL 13 (BIT) ×2 IMPLANT
BIT DRILL 13MM (BIT) ×1
BRUSH SCRUB EZ PLAIN DRY (MISCELLANEOUS) ×3 IMPLANT
BUR MATCHSTICK NEURO 3.0 LAGG (BURR) ×3 IMPLANT
CANISTER SUCT 3000ML (MISCELLANEOUS) ×3 IMPLANT
CLOSURE WOUND 1/2 X4 (GAUZE/BANDAGES/DRESSINGS) ×1
CONT SPEC 4OZ CLIKSEAL STRL BL (MISCELLANEOUS) ×3 IMPLANT
DECANTER SPIKE VIAL GLASS SM (MISCELLANEOUS) ×3 IMPLANT
DRAIN SNY WOU 7FLT (WOUND CARE) ×3 IMPLANT
DRAPE C-ARM 42X72 X-RAY (DRAPES) ×6 IMPLANT
DRAPE LAPAROTOMY 100X72 PEDS (DRAPES) ×3 IMPLANT
DRAPE MICROSCOPE LEICA (MISCELLANEOUS) ×3 IMPLANT
DRAPE POUCH INSTRU U-SHP 10X18 (DRAPES) ×3 IMPLANT
DRSG OPSITE POSTOP 4X6 (GAUZE/BANDAGES/DRESSINGS) ×3 IMPLANT
DURAPREP 6ML APPLICATOR 50/CS (WOUND CARE) ×3 IMPLANT
ELECT COATED BLADE 2.86 ST (ELECTRODE) ×3 IMPLANT
ELECT REM PT RETURN 9FT ADLT (ELECTROSURGICAL) ×3
ELECTRODE REM PT RTRN 9FT ADLT (ELECTROSURGICAL) ×1 IMPLANT
EVACUATOR SILICONE 100CC (DRAIN) ×3 IMPLANT
GAUZE SPONGE 4X4 12PLY STRL (GAUZE/BANDAGES/DRESSINGS) ×3 IMPLANT
GAUZE SPONGE 4X4 16PLY XRAY LF (GAUZE/BANDAGES/DRESSINGS) IMPLANT
GLOVE BIO SURGEON STRL SZ8 (GLOVE) ×3 IMPLANT
GLOVE BIOGEL PI IND STRL 7.0 (GLOVE) ×2 IMPLANT
GLOVE BIOGEL PI INDICATOR 7.0 (GLOVE) ×4
GLOVE ECLIPSE 9.0 STRL (GLOVE) ×3 IMPLANT
GLOVE EXAM NITRILE LRG STRL (GLOVE) IMPLANT
GLOVE EXAM NITRILE MD LF STRL (GLOVE) IMPLANT
GLOVE EXAM NITRILE XL STR (GLOVE) IMPLANT
GLOVE EXAM NITRILE XS STR PU (GLOVE) IMPLANT
GLOVE INDICATOR 8.5 STRL (GLOVE) ×3 IMPLANT
GLOVE SS BIOGEL STRL SZ 6.5 (GLOVE) ×2 IMPLANT
GLOVE SUPERSENSE BIOGEL SZ 6.5 (GLOVE) ×4
GOWN STRL REUS W/ TWL LRG LVL3 (GOWN DISPOSABLE) ×1 IMPLANT
GOWN STRL REUS W/ TWL XL LVL3 (GOWN DISPOSABLE) ×2 IMPLANT
GOWN STRL REUS W/TWL 2XL LVL3 (GOWN DISPOSABLE) IMPLANT
GOWN STRL REUS W/TWL LRG LVL3 (GOWN DISPOSABLE) ×2
GOWN STRL REUS W/TWL XL LVL3 (GOWN DISPOSABLE) ×4
HALTER HD/CHIN CERV TRACTION D (MISCELLANEOUS) ×3 IMPLANT
HEMOSTAT POWDER KIT SURGIFOAM (HEMOSTASIS) ×6 IMPLANT
KIT BASIN OR (CUSTOM PROCEDURE TRAY) ×3 IMPLANT
KIT ROOM TURNOVER OR (KITS) ×3 IMPLANT
LIQUID BAND (GAUZE/BANDAGES/DRESSINGS) ×3 IMPLANT
NEEDLE SPNL 20GX3.5 QUINCKE YW (NEEDLE) ×3 IMPLANT
NS IRRIG 1000ML POUR BTL (IV SOLUTION) ×3 IMPLANT
PACK LAMINECTOMY NEURO (CUSTOM PROCEDURE TRAY) ×3 IMPLANT
PLATE 3 55XLCK NS SPNE CVD (Plate) ×1 IMPLANT
PLATE 3 ATLANTIS TRANS (Plate) ×2 IMPLANT
RUBBERBAND STERILE (MISCELLANEOUS) ×6 IMPLANT
SCREW ST FIX 4 ATL 3120213 (Screw) ×24 IMPLANT
SPACER CERV FRGE 12X14X5-0 (Spacer) ×3 IMPLANT
SPACER CERV FRGE 12X14X6-0 (Spacer) ×3 IMPLANT
SPACER CERVICAL FRGE 12X14X7-7 (Spacer) ×3 IMPLANT
SPONGE INTESTINAL PEANUT (DISPOSABLE) ×3 IMPLANT
SPONGE SURGIFOAM ABS GEL 100 (HEMOSTASIS) ×3 IMPLANT
STRIP CLOSURE SKIN 1/2X4 (GAUZE/BANDAGES/DRESSINGS) ×2 IMPLANT
SUT VIC AB 3-0 SH 8-18 (SUTURE) ×3 IMPLANT
SUT VICRYL 4-0 PS2 18IN ABS (SUTURE) ×3 IMPLANT
SYR 20ML ECCENTRIC (SYRINGE) ×3 IMPLANT
TAPE CLOTH 4X10 WHT NS (GAUZE/BANDAGES/DRESSINGS) IMPLANT
TOWEL OR 17X24 6PK STRL BLUE (TOWEL DISPOSABLE) ×3 IMPLANT
TOWEL OR 17X26 10 PK STRL BLUE (TOWEL DISPOSABLE) ×3 IMPLANT
TRAP SPECIMEN MUCOUS 40CC (MISCELLANEOUS) ×3 IMPLANT
WATER STERILE IRR 1000ML POUR (IV SOLUTION) ×3 IMPLANT

## 2014-06-05 NOTE — Anesthesia Procedure Notes (Signed)
Procedure Name: Intubation Date/Time: 06/05/2014 7:36 AM Performed by: Melina Copa, Pravin Perezperez R Pre-anesthesia Checklist: Patient identified, Emergency Drugs available, Suction available, Patient being monitored and Timeout performed Patient Re-evaluated:Patient Re-evaluated prior to inductionOxygen Delivery Method: Circle system utilized Preoxygenation: Pre-oxygenation with 100% oxygen Intubation Type: IV induction Ventilation: Mask ventilation without difficulty Grade View: Grade I Tube type: Oral Tube size: 7.5 mm Number of attempts: 1 Airway Equipment and Method: Rigid stylet and Video-laryngoscopy Placement Confirmation: ETT inserted through vocal cords under direct vision and positive ETCO2 Secured at: 21 cm Tube secured with: Tape Dental Injury: Teeth and Oropharynx as per pre-operative assessment

## 2014-06-05 NOTE — H&P (Signed)
Mary Harrell is an 63 y.o. female.   Chief Complaint: Neck and left arm pain HPI: Patient is a very pleasant 63 year female who has had long-standing and progressive neck pain with radiation to her left shoulder down to her left tricep and the first 2 fingers of her left hand. She occasionally has had numbness and tingling in the same distribution. Workup has revealed severe cervical spondylosis with stenosis and spinal cord compression at C3-4 C4-5 and C5-6. Due to her progression of clinical syndrome failure conservative treatment imaging findings and I have recommended to her cervical discectomy and fusion at C3-4, C4-5, C5-6. I have extensively reviewed the risks and benefits of the operation with the patient as well as perioperative course expectations of outcome and alternatives of surgery and she understands and agrees to proceed forward.  Past Medical History  Diagnosis Date  . Complication of anesthesia     Pt stated it takes like 6 months to get anesthesia out of her system  . Mitral valve prolapse   . History of kidney stones   . Depression   . Anxiety   . GERD (gastroesophageal reflux disease)   . Arthritis   . Skin cancer     Squamous cell removed  . Headache     migraines  . Hypoglycemia   . Hyperlipemia     Past Surgical History  Procedure Laterality Date  . Bunionectomy Bilateral   . Skin tag removal    . Squamous cell carcinoma excision  2011    removed from chest  . Breast reduction surgery  1976  . Belpharoptosis repair Bilateral   . Carpal tunnel release Bilateral     History reviewed. No pertinent family history. Social History:  reports that she has never smoked. She does not have any smokeless tobacco history on file. She reports that she drinks alcohol. She reports that she does not use illicit drugs.  Allergies:  Allergies  Allergen Reactions  . Morphine Nausea And Vomiting  . Tramadol Nausea And Vomiting and Other (See Comments)    Inability to  urinate for many hours  . Ciprofloxacin Other (See Comments)    Causes tightness in calf muscles    Medications Prior to Admission  Medication Sig Dispense Refill  . aspirin EC 81 MG tablet Take 81 mg by mouth daily.    . B Complex Vitamins (VITAMIN B COMPLEX PO) Take 1 tablet by mouth daily.     . Calcium Citrate-Vitamin D (CITRACAL + D PO) Take 1-2 tablets by mouth daily. Take 2 tablets every morning and 1 tablet every evening    . Estradiol (VAGIFEM) 10 MCG TABS vaginal tablet Place 10 mcg vaginally every 14 (fourteen) days. 1st and 15th of each month    . Glucosamine-Chondroitin (GLUCOSAMINE CHONDR COMPLEX PO) Take 1 tablet by mouth every other day.     . Omega-3 Fatty Acids (FISH OIL PO) Take 2 tablets by mouth daily.    . simvastatin (ZOCOR) 40 MG tablet Take 20 mg by mouth daily.  1  . SUMAtriptan (IMITREX) 100 MG tablet Take 100 mg by mouth as needed for migraine.   1    No results found for this or any previous visit (from the past 48 hour(s)). No results found.  Review of Systems  Constitutional: Negative.   Eyes: Negative.   Respiratory: Negative.   Cardiovascular: Negative.   Gastrointestinal: Negative.   Genitourinary: Negative.   Musculoskeletal: Positive for joint pain and neck pain.  Skin: Negative.   Neurological: Positive for tingling and sensory change.  Psychiatric/Behavioral: Negative.     Blood pressure 118/58, pulse 65, temperature 97.8 F (36.6 C), temperature source Oral, resp. rate 20, SpO2 100 %. Physical Exam  Constitutional: She is oriented to person, place, and time.  HENT:  Head: Normocephalic and atraumatic.  Eyes: Pupils are equal, round, and reactive to light.  Neck: Normal range of motion.  Cardiovascular: Normal rate.   Respiratory: Effort normal.  GI: Soft. Bowel sounds are normal.  Neurological: She is alert and oriented to person, place, and time. She has normal strength. GCS eye subscore is 4. GCS verbal subscore is 5. GCS motor  subscore is 6.  Strength is 5 out of 5 in her deltoid, bicep, tricep, wrist flexion, wrist extension, and intrinsics.  Skin: Skin is warm and dry.     Assessment/Plan 63 year old female presents for ACDF at C3-4, C4-5, C5-6.   Jeneen Doutt P 06/05/2014, 7:11 AM

## 2014-06-05 NOTE — Progress Notes (Signed)
Utilization review completed.  

## 2014-06-05 NOTE — Progress Notes (Signed)
Checked with [facility] for ETA to transfer pt. Will call when ready

## 2014-06-05 NOTE — Addendum Note (Signed)
Addendum  created 06/05/14 1114 by Moshe Salisbury, CRNA   Modules edited: Anesthesia Blocks and Procedures, Clinical Notes   Clinical Notes:  File: [id number]

## 2014-06-05 NOTE — Anesthesia Postprocedure Evaluation (Signed)
  Anesthesia Post-op Note  Patient: Mary Harrell  Procedure(s) Performed: Procedure(s): CERVICAL THREE-FOUR, CERVICAL FOUR-FIVE, CERVICAL FIVE-SIX ANTERIOR CERVICAL DECOMPRESSION/DISCECTOMY FUSION 3 LEVELS (N/A) Patient Location: PACU  Anesthesia Type:General  Level of Consciousness: awake, oriented, sedated and patient cooperative  Airway and Oxygen Therapy: Patient Spontanous Breathing  Post-op Pain: mild  Post-op Assessment: Post-op Vital signs reviewed, Patient's Cardiovascular Status Stable, Respiratory Function Stable, Patent Airway, No signs of Nausea or vomiting and Pain level controlled  Post-op Vital Signs: stable  Last Vitals:  Filed Vitals:   06/05/14 1045  BP: 138/61  Pulse:   Temp:   Resp: 20    Complications: No apparent anesthesia complications

## 2014-06-05 NOTE — Op Note (Signed)
Preoperative diagnosis: Cervical spondylosis with stenosis C3-4, C4-5, C5-6.  Postoperative diagnosis: Same  Procedure: Anterior cervical discectomies and fusion at C3-4, C4-5, C5-6. Utilizing allograft spacers and the Atlantis translational plate  Surgeon: Dominica Severin Iya Hamed  Asst.: Jonni Sanger pool  Anesthesia: Gen.  EBL: Minimal  History of present illness: Patient is a very pleasant 63 year old female has had long-standing neck pain and pain predominantly in the left shoulder and arm pain through tricep and down the first 2 fingers of her left hand. This been refractory to all forms of conservative treatment imaging revealed severe cervical stenosis and spondylosis causing spinal cord compression and foraminal stenosis C3-4, C4-5, C5-6. I have extensively reviewed the risks and benefits of an anterior cervical discectomy and fusion at those levels as well as perioperative course expectations of outcome and alternatives of surgery and she understood and agreed to proceed forward.  Operative procedure: Patient brought into the or was induced under general anesthesia positioned supine the neck in slight extension in 5 pounds of halter traction the right-sided neck was prepped and draped in routine sterile fashion preoperative x-ray localize the appropriate level and after the appropriate level was identified a curvilinear incision was made just off midline to the anterior border of the sternomastoid and the superficial layer of the platysmas dissected and divided longitudinally the avascular plane to sternomastoid and strap muscles was developed down the prevertebral fascia. Fascia dissected with Kitners. Interoperative x-ray confirmed the location appropriate level so it was marked with a 15 blade scalpel and then the longus Cole as reflected laterally and self-retaining retractor was placed. All 3 disc spaces were then incised and large osteophytes were bitten off with a 3 mm Kerrison punch at all 3 levels. Then  under endoscopic illumination first working at C3-4 disc space is drilled down the posterior annulus and osteophytic complex. Under mask of illumination aggressive unamenable both endplates was carried out identification of the PLL was carried out and this was removed in piecemeal fashion. The dura was then identified and large spurs removed and the posterior aspect of the C3 vertebral body decompress the central canal. Marching laterally both C4 foramina were identified both C4 pedicles were identified both C4 nerve roots were skeletonized flush with pedicle. A 5 mm parallel graft was then inserted at this level and tensions and taken to C5-6. In a similar fashion C5-6 was drilled down the posterior annulus and posterior complex the PLL was removed in piecemeal fashion there was a dense amount of osteophytes causing severe stenosis of both C6 nerve roots predominantly on the left. These were all aggressively removed skeletonizing both C6 nerve roots flush with pedicle and decompress the central canal by aggressively under biting both in place. This was impacted Gelfoam to second C4-5. At C4-5 the pathology was a large disc herniation centrally this was removed the endplates were under bitten decompress the central canal further. Then both a 7 mm lordotic and a 6 moment a parallel was inserted at C5-6 and C6-7. A 55 mm Atlantis translational plate was selected this was confirmed via good sizing based on x-ray all screws were placed and excellent purchase locking mechanisms were engaged the wounds and copiously irrigated meticulous hemostasis was maintained a drain was placed and the wounds closed in layers with after Vicryl in the platysma and a running 4 subcuticular. Point Steri-Strips and sterile dressing were applied patient recovered in stable condition. At the end of case on the account sponge counts were correct.

## 2014-06-05 NOTE — Transfer of Care (Signed)
Immediate Anesthesia Transfer of Care Note  Patient: Mary Harrell  Procedure(s) Performed: Procedure(s): CERVICAL THREE-FOUR, CERVICAL FOUR-FIVE, CERVICAL FIVE-SIX ANTERIOR CERVICAL DECOMPRESSION/DISCECTOMY FUSION 3 LEVELS (N/A)  Patient Location: PACU  Anesthesia Type:General  Level of Consciousness: awake  Airway & Oxygen Therapy: Patient Spontanous Breathing and Patient connected to nasal cannula oxygen  Post-op Assessment: Report given to PACU RN, Post -op Vital signs reviewed and stable and Patient moving all extremities  Post vital signs: Reviewed and stable  Complications: No apparent anesthesia complications

## 2014-06-05 NOTE — Anesthesia Preprocedure Evaluation (Addendum)
Anesthesia Evaluation  Patient identified by MRN, date of birth, ID band Patient awake    Reviewed: Allergy & Precautions, H&P , NPO status , Patient's Chart, lab work & pertinent test results  Airway Mallampati: II  TM Distance: >3 FB Neck ROM: Limited    Dental  (+) Teeth Intact, Dental Advisory Given   Pulmonary          Cardiovascular     Neuro/Psych  Headaches, Anxiety Depression    GI/Hepatic GERD-  ,  Endo/Other    Renal/GU Renal disease     Musculoskeletal  (+) Arthritis -,   Abdominal   Peds  Hematology   Anesthesia Other Findings   Reproductive/Obstetrics                            Anesthesia Physical Anesthesia Plan  ASA: I  Anesthesia Plan: General   Post-op Pain Management:    Induction: Intravenous  Airway Management Planned: Oral ETT  Additional Equipment:   Intra-op Plan:   Post-operative Plan: Extubation in OR  Informed Consent: I have reviewed the patients History and Physical, chart, labs and discussed the procedure including the risks, benefits and alternatives for the proposed anesthesia with the patient or authorized representative who has indicated his/her understanding and acceptance.   Dental advisory given  Plan Discussed with: CRNA, Anesthesiologist and Surgeon  Anesthesia Plan Comments:        Anesthesia Quick Evaluation

## 2014-06-06 ENCOUNTER — Encounter (HOSPITAL_COMMUNITY): Payer: Self-pay | Admitting: Neurosurgery

## 2014-06-06 MED ORDER — HYDROMORPHONE HCL 2 MG PO TABS: 2.0000 mg | ORAL_TABLET | ORAL | Status: DC | PRN

## 2014-06-06 MED ORDER — HYDROMORPHONE HCL 2 MG PO TABS
2.0000 mg | ORAL_TABLET | ORAL | Status: DC | PRN
  Administered 2014-06-06: 2 mg via ORAL
  Filled 2014-06-06: qty 1

## 2014-06-06 MED ORDER — HYDROCODONE-ACETAMINOPHEN 5-325 MG PO TABS: 1.0000 | ORAL_TABLET | ORAL | Status: DC | PRN

## 2014-06-06 NOTE — Progress Notes (Signed)
Patient ID: Mary Harrell, female   DOB: [birth year], 63 y.o.   MRN: [id number] Doing well still some nausea interscapular pain  Ambulating and voiding wound clean dry and intact  Continue observation possible discharged later today

## 2014-06-06 NOTE — Discharge Summary (Signed)
  Physician Discharge Summary  Patient ID: Mary Harrell MRN: [id number] DOB/AGE: 65/05/1951 65 y.o.  Admit date: 06/05/2014 Discharge date: 06/06/2014  Admission Diagnoses: Cervical spondylosis with stenosis C3 C6  Discharge Diagnoses: Same Active Problems:   Spinal stenosis of cervical region   Spinal stenosis in cervical region   Discharged Condition: good  Hospital Course: Patient is better the hospital underwent an ACDF from C3-6 postoperatively patient did very well went to recovery room and the floor on the floor she was convalescing well and living and voiding spontaneously had some trouble with nausea but this was resolving by the end of posterior day 1. Patient stable for discharge home should her nausea be improved and tolerating oral Dilaudid.  Consults: Significant Diagnostic Studies: Treatments: ACDF C3-4, C4-5, C5-6. Discharge Exam: Blood pressure 128/63, pulse 78, temperature 98.6 F (37 C), temperature source Oral, resp. rate 16, SpO2 100 %. Strength out of 5 wound clean dry and intact  Disposition: Home     Medication List    TAKE these medications        aspirin EC 81 MG tablet  Take 81 mg by mouth daily.     CITRACAL + D PO  Take 1-2 tablets by mouth daily. Take 2 tablets every morning and 1 tablet every evening     FISH OIL PO  Take 2 tablets by mouth daily.     GLUCOSAMINE CHONDR COMPLEX PO  Take 1 tablet by mouth every other day.     HYDROmorphone 2 MG tablet  Commonly known as:  DILAUDID  Take 1 tablet (2 mg total) by mouth every 4 (four) hours as needed for severe pain.     simvastatin 40 MG tablet  Commonly known as:  ZOCOR  Take 20 mg by mouth daily.     SUMAtriptan 100 MG tablet  Commonly known as:  IMITREX  Take 100 mg by mouth as needed for migraine.     VAGIFEM 10 MCG Tabs vaginal tablet  Generic drug:  Estradiol  Place 10 mcg vaginally every 14 (fourteen) days. 1st and 15th of each month     VITAMIN B COMPLEX PO  Take 1  tablet by mouth daily.           Follow-up Information    Follow up with [facility], MD.   Specialty:  Neurosurgery   Contact information:   [address] [place] [address] [phone]       Signed: Elaina Hoops 06/06/2014, 3:38 PM

## 2014-06-06 NOTE — Progress Notes (Signed)
Pt and husband given D/C instructions with Rx, verbal understanding was provided. Pt's incision is clean and dry with no sign of infection. Pt's IV was removed prior to D/C. Pt D/C'd home via wheelchair @ 1740 per MD order. Pt is stable @ D/C and has no other needs at this time. Holli Humbles, RN

## 2014-06-06 NOTE — Discharge Instructions (Signed)
Anterior and Posterior Decompressions with Fusions Decompression is a procedure performed to relieve symptoms caused by pressure or compression on the spinal cord and nerve roots. The spinal cord is a cord of nervous tissue. It extends from the brain and passes through the spinal canal (passage formed by the openings in the backbone). Nerves branch out from the spinal cord to different parts of the body. Vertebrae (backbones) are a group of individual bones. They form the spinal column. Each vertebra is made up of lamina (bony arches of the spinal canal), spine, and foramen (opening in the backbone). A disc is a soft, gel-like cushion between each vertebra.  Decompression can be carried out as an anterior or posterior procedure. Anterior decompression is where the surgeon makes an incision more toward the front of your body in order to get access to the area needing repair. Posterior decompression is where the surgeon makes an incision more toward the back of your body in order to get access to the area needing repair. Depending upon the details of your specific condition, there are reasons why the surgeon may choose one approach over the other. CAUSES Compression of spinal cord and nerves can be caused by:  Bulging or collapse of the spinal disc.  Loosening of the ligaments.  Bony growth. The causes of the spinal cord compression include:  Degenerative diseases (such as with many arthritis problems).  Rupture of the disc.  Traumatic injury.  Spinal stenosis (narrowing of the spinal canal).  Pott disease (tuberculosis of the spine). LET YOUR CAREGIVER KNOW ABOUT:   Bleeding and clotting problems.  Allergy to anesthesia medications.  Allergy to medications like steroid and blood thinners.  Previous surgeries.  Bone diseases like disease of low bone mass (osteoporosis) and infection of bone (osteomyelitis).  Cancerous (neoplastic) conditions. RISKS AND COMPLICATIONS  Bleeding and  collection of blood outside the blood vessels.  Damage to the blood vessels. This can cause heavy bleeding and even death.  Damage to the nerves. This can cause pain, loss of sensation, paralysis, and weakness.  Damage to the covering of the spinal cord (meninges). This can cause the cerebrospinal fluid to leak.  Complications involving graft and plate.  Inadequate fusion.  Wound infection. BEFORE THE PROCEDURE  Your caregiver will make sure that you have been given a reasonable trial of nonsurgical treatment methods. Your caregiver may perform an X-ray study with a dye (diskogram). The injection of a dye into a disc may produce a pain similar to your back pain. This will help your caregiver to identify the disc that is the source of pain.  PROCEDURE Your surgeon will perform spinal decompression and fusion while you are under general anesthesia. There are different methods of performing decompression surgery. They include the following:  Diskectomy. Your surgeon will remove a portion of a spinal disc that is causing pressure on the nerve roots.  Laminotomy or laminectomy. Lamina refers to bony arches of the spinal canal. Laminotomy is a procedure in which your surgeon removes a small portion of lamina. Laminectomy is the removal of an entire lamina.  Foraminotomy or foraminectomy. Foramen refers to the opening in the backbone for nerve roots. Foraminotomy is the removal of a small amount of bone and tissue forming the foramen. Foraminectomy is the removal of a large amount of bone and tissue.  Osteophyte removal. Your surgeon removes bony growths called osteophytes. These cause pressure on the spinal column.  Corpectomy. Corpus refers to the body of vertebra. This procedure involves  the removal of the body of a vertebra and also the discs.  Spinal fusion is required if there is a curvature of the spine or forward slip of a vertebra. Spinal fusion is a procedure of fusing two or more  vertebrae together with a bone graft (new bone or substitute material from your body). This is further strengthened by using screws, plate, and cage apparatus.  Fusion prevents motion between 2 vertebrae. It also prevents the curvature of the spine and slip of vertebra from getting worse after surgery. AFTER THE PROCEDURE   You will be moved to the recovery room and then to the hospital room.  You will have a thin tube (catheter) inserted into the urinary bladder to help in urination.  Your may have pain for the first few days after the surgery. Your caregiver will give you medications to control pain.  Your caregiver may order blood tests to monitor oxygen carrying protein of the red blood cells (hemoglobin). This would be due to blood loss during the surgery. Hemoglobin should be monitored to make sure that the level of blood oxygen does not become too low.  You will be given a back brace. A back brace limits motion and helps fusion of the bone.  You may continue to have mild pain even after full recovery.  You may have a series X-ray examinations over time. This will ensure adequate healing and appropriate alignment at the site of operation. HOME CARE INSTRUCTIONS   To get strength and function, start physical therapy, occupational therapy, and exercises.  To ease the pain, you may have to exercise regularly. That also helps in weight loss.  To keep your spine in proper alignment, you should sit, stand, walk, turn in bed, and reposition yourself as instructed.  At first, take only short walks. Slowly increase other activities.  Avoid smoking. Nicotine inhibits fusion of the bone.  If narcotics (pain medication) are prescribed, you should not drink alcohol. You should not drive when you are on this medication because you will feel drowsy.  Avoid use of pain medication products and nonsteroidal anti-inflammatory agents (pain medication). They interfere with the development and growth  of new bone cells.  Your caregiver may recommend using ice to manage pain. Ask your caregiver for instructions on how to do it.  Avoid lifting heavy objects.  Avoid bending and twisting motions. SEEK MEDICAL CARE IF:   You have increased pain.  There is expanding redness at the operative site.  You have a fever. SEEK IMMEDIATE MEDICAL CARE IF:   You have any discomfort with the substitute material that has been used during the spinal fusion procedure.  You feel a collection of blood outside the blood vessels.  You notice any changes in the smell, appearance, or amount of drainage. Document Released: 06/15/2007 Document Revised: 10/10/2013 Document Reviewed: 10/16/2007 [facility] Patient Information 2015 [place], [place]. This information is not intended to replace advice given to you by your health care provider. Make sure you discuss any questions you have with your health care provider.

## 2014-07-06 DIAGNOSIS — M5023 Other cervical disc displacement, cervicothoracic region: Secondary | ICD-10-CM | POA: Diagnosis not present

## 2014-07-06 DIAGNOSIS — M5022 Other cervical disc displacement, mid-cervical region: Secondary | ICD-10-CM | POA: Diagnosis not present

## 2014-07-06 DIAGNOSIS — M542 Cervicalgia: Secondary | ICD-10-CM | POA: Diagnosis not present

## 2014-07-06 DIAGNOSIS — Z6826 Body mass index (BMI) 26.0-26.9, adult: Secondary | ICD-10-CM | POA: Diagnosis not present

## 2014-07-14 DIAGNOSIS — M542 Cervicalgia: Secondary | ICD-10-CM | POA: Diagnosis not present

## 2014-08-07 DIAGNOSIS — M542 Cervicalgia: Secondary | ICD-10-CM | POA: Diagnosis not present

## 2014-08-17 DIAGNOSIS — Z6826 Body mass index (BMI) 26.0-26.9, adult: Secondary | ICD-10-CM | POA: Diagnosis not present

## 2014-08-17 DIAGNOSIS — M5022 Other cervical disc displacement, mid-cervical region: Secondary | ICD-10-CM | POA: Diagnosis not present

## 2014-08-17 DIAGNOSIS — M5023 Other cervical disc displacement, cervicothoracic region: Secondary | ICD-10-CM | POA: Diagnosis not present

## 2014-09-28 DIAGNOSIS — E780 Pure hypercholesterolemia: Secondary | ICD-10-CM | POA: Diagnosis not present

## 2014-09-28 DIAGNOSIS — N390 Urinary tract infection, site not specified: Secondary | ICD-10-CM | POA: Diagnosis not present

## 2014-09-28 DIAGNOSIS — R8290 Unspecified abnormal findings in urine: Secondary | ICD-10-CM | POA: Diagnosis not present

## 2014-09-28 DIAGNOSIS — Z79899 Other long term (current) drug therapy: Secondary | ICD-10-CM | POA: Diagnosis not present

## 2014-10-16 DIAGNOSIS — N390 Urinary tract infection, site not specified: Secondary | ICD-10-CM | POA: Diagnosis not present

## 2014-10-16 DIAGNOSIS — R8290 Unspecified abnormal findings in urine: Secondary | ICD-10-CM | POA: Diagnosis not present

## 2014-10-16 DIAGNOSIS — Z79899 Other long term (current) drug therapy: Secondary | ICD-10-CM | POA: Diagnosis not present

## 2014-10-16 DIAGNOSIS — E780 Pure hypercholesterolemia: Secondary | ICD-10-CM | POA: Diagnosis not present

## 2014-10-18 DIAGNOSIS — Z23 Encounter for immunization: Secondary | ICD-10-CM | POA: Diagnosis not present

## 2014-10-18 DIAGNOSIS — N952 Postmenopausal atrophic vaginitis: Secondary | ICD-10-CM | POA: Diagnosis not present

## 2014-10-18 DIAGNOSIS — Z0000 Encounter for general adult medical examination without abnormal findings: Secondary | ICD-10-CM | POA: Diagnosis not present

## 2014-10-18 DIAGNOSIS — Z6826 Body mass index (BMI) 26.0-26.9, adult: Secondary | ICD-10-CM | POA: Diagnosis not present

## 2014-10-18 DIAGNOSIS — G43109 Migraine with aura, not intractable, without status migrainosus: Secondary | ICD-10-CM | POA: Diagnosis not present

## 2014-10-18 DIAGNOSIS — E780 Pure hypercholesterolemia: Secondary | ICD-10-CM | POA: Diagnosis not present

## 2014-10-18 DIAGNOSIS — E663 Overweight: Secondary | ICD-10-CM | POA: Diagnosis not present

## 2014-10-18 DIAGNOSIS — M5106 Intervertebral disc disorders with myelopathy, lumbar region: Secondary | ICD-10-CM | POA: Diagnosis not present

## 2014-10-19 DIAGNOSIS — M5022 Other cervical disc displacement, mid-cervical region: Secondary | ICD-10-CM | POA: Diagnosis not present

## 2014-10-19 DIAGNOSIS — Z6826 Body mass index (BMI) 26.0-26.9, adult: Secondary | ICD-10-CM | POA: Diagnosis not present

## 2014-10-19 DIAGNOSIS — M5023 Other cervical disc displacement, cervicothoracic region: Secondary | ICD-10-CM | POA: Diagnosis not present

## 2014-10-19 DIAGNOSIS — M542 Cervicalgia: Secondary | ICD-10-CM | POA: Diagnosis not present

## 2014-12-06 DIAGNOSIS — H524 Presbyopia: Secondary | ICD-10-CM | POA: Diagnosis not present

## 2014-12-06 DIAGNOSIS — H2513 Age-related nuclear cataract, bilateral: Secondary | ICD-10-CM | POA: Diagnosis not present

## 2014-12-28 ENCOUNTER — Other Ambulatory Visit (HOSPITAL_COMMUNITY): Payer: Self-pay | Admitting: Neurosurgery

## 2014-12-28 ENCOUNTER — Other Ambulatory Visit: Payer: Self-pay | Admitting: Neurosurgery

## 2014-12-28 DIAGNOSIS — R1314 Dysphagia, pharyngoesophageal phase: Secondary | ICD-10-CM

## 2014-12-28 DIAGNOSIS — M542 Cervicalgia: Secondary | ICD-10-CM | POA: Diagnosis not present

## 2014-12-28 DIAGNOSIS — S129XXA Fracture of neck, unspecified, initial encounter: Secondary | ICD-10-CM

## 2014-12-28 DIAGNOSIS — Z6826 Body mass index (BMI) 26.0-26.9, adult: Secondary | ICD-10-CM | POA: Diagnosis not present

## 2015-01-03 ENCOUNTER — Ambulatory Visit (HOSPITAL_COMMUNITY)
Admission: RE | Admit: 2015-01-03 | Discharge: 2015-01-03 | Disposition: A | Discharge status: Home / Self Care | Payer: Medicare Other | Source: Ambulatory Visit | Attending: Neurosurgery | Admitting: Neurosurgery

## 2015-01-03 DIAGNOSIS — R1314 Dysphagia, pharyngoesophageal phase: Secondary | ICD-10-CM | POA: Insufficient documentation

## 2015-01-03 NOTE — Procedures (Signed)
Objective Swallowing Evaluation: Other (Comment) (MBSS)  Patient Details  Name: Mary Harrell MRN: [id number] Date of Birth: 02-11-1951  Today's Date: 01/03/2015 Time: SLP Start Time (ACUTE ONLY): 1157-SLP Stop Time (ACUTE ONLY): 1216 SLP Time Calculation (min) (ACUTE ONLY): 19 min  Past Medical History:  Past Medical History  Diagnosis Date  . Complication of anesthesia     Pt stated it takes like 6 months to get anesthesia out of her system  . Mitral valve prolapse   . History of kidney stones   . Depression   . Anxiety   . GERD (gastroesophageal reflux disease)   . Arthritis   . Skin cancer     Squamous cell removed  . Headache     migraines  . Hypoglycemia   . Hyperlipemia    Past Surgical History:  Past Surgical History  Procedure Laterality Date  . Bunionectomy Bilateral   . Skin tag removal    . Squamous cell carcinoma excision  2011    removed from chest  . Breast reduction surgery  1976  . Belpharoptosis repair Bilateral   . Carpal tunnel release Bilateral   . Anterior cervical decomp/discectomy fusion N/A 06/05/2014    Procedure: CERVICAL THREE-FOUR, CERVICAL FOUR-FIVE, CERVICAL FIVE-SIX ANTERIOR CERVICAL DECOMPRESSION/DISCECTOMY FUSION 3 LEVELS;  Surgeon: Elaina Hoops, MD;  Location: [facility];  Service: Neurosurgery;  Laterality: N/A;   HPI:  Other Pertinent Information: 64 yo female s/p ACDF 05/2014 who presents with difficulty swallowing solids and pills.  No Data Recorded  Assessment / Plan / Recommendation CHL IP CLINICAL IMPRESSIONS 01/03/2015  Therapy Diagnosis Mild pharyngeal phase dysphagia;Mild cervical esophageal phase dysphagia   Clinical Impression Pt has a mild pharyngeal and cervical esophageal dysphagia. Cervical hardware impedes full epiglottic deflection, although airway protection is adequate. There is residue that remains primarily in the valleculae across all consistencies, although the barium tablet (halved) pauses briefly at the UES,  requiring a liquid wash to enter into the esophagus. The UES does not appear to adequately relax due to prominence at the level of C5, although this only interfered with transit of the pill. Would continue regular textures and thin liquids as tolerated. Pt may wish to consume pills in puree to facilitate clearance into the esophagus.       CHL IP TREATMENT RECOMMENDATION 01/03/2015  Treatment Recommendations No treatment recommended at this time     CHL IP DIET RECOMMENDATION 01/03/2015  SLP Diet Recommendations Age appropriate regular solids;Thin  Liquid Administration via (None)  Medication Administration Whole meds with puree  Compensations Slow rate;Small sips/bites  Postural Changes and/or Swallow Maneuvers (None)     CHL IP OTHER RECOMMENDATIONS 01/03/2015  Recommended Consults (None)  Oral Care Recommendations Oral care BID  Other Recommendations (None)     No flowsheet data found.   No flowsheet data found.   Pertinent Vitals/Pain: n/a     SLP Swallow Goals No flowsheet data found.  No flowsheet data found.    CHL IP REASON FOR REFERRAL 01/03/2015  Reason for Referral Objectively evaluate swallowing function     CHL IP ORAL PHASE 01/03/2015  Lips (None)  Tongue (None)  Mucous membranes (None)  Nutritional status (None)  Other (None)  Oxygen therapy (None)  Oral Phase WFL  Oral - Pudding Teaspoon (None)  Oral - Pudding Cup (None)  Oral - Honey Teaspoon (None)  Oral - Honey Cup (None)  Oral - Honey Syringe (None)  Oral - Nectar Teaspoon (None)  Oral - Nectar Cup (  None)  Oral - Nectar Straw (None)  Oral - Nectar Syringe (None)  Oral - Ice Chips (None)  Oral - Thin Teaspoon (None)  Oral - Thin Cup (None)  Oral - Thin Straw (None)  Oral - Thin Syringe (None)  Oral - Puree (None)  Oral - Mechanical Soft (None)  Oral - Regular (None)  Oral - Multi-consistency (None)  Oral - Pill (None)  Oral Phase - Comment (None)      CHL IP PHARYNGEAL PHASE 01/03/2015   Pharyngeal Phase Impaired  Pharyngeal - Pudding Teaspoon (None)  Penetration/Aspiration details (pudding teaspoon) (None)  Pharyngeal - Pudding Cup (None)  Penetration/Aspiration details (pudding cup) (None)  Pharyngeal - Honey Teaspoon (None)  Penetration/Aspiration details (honey teaspoon) (None)  Pharyngeal - Honey Cup (None)  Penetration/Aspiration details (honey cup) (None)  Pharyngeal - Honey Syringe (None)  Penetration/Aspiration details (honey syringe) (None)  Pharyngeal - Nectar Teaspoon (None)  Penetration/Aspiration details (nectar teaspoon) (None)  Pharyngeal - Nectar Cup (None)  Penetration/Aspiration details (nectar cup) (None)  Pharyngeal - Nectar Straw (None)  Penetration/Aspiration details (nectar straw) (None)  Pharyngeal - Nectar Syringe (None)  Penetration/Aspiration details (nectar syringe) (None)  Pharyngeal - Ice Chips (None)  Penetration/Aspiration details (ice chips) (None)  Pharyngeal - Thin Teaspoon (None)  Penetration/Aspiration details (thin teaspoon) (None)  Pharyngeal - Thin Cup (None)  Penetration/Aspiration details (thin cup) (None)  Pharyngeal - Thin Straw (None)  Penetration/Aspiration details (thin straw) (None)  Pharyngeal - Thin Syringe (None)  Penetration/Aspiration details (thin syringe') (None)  Pharyngeal - Puree (None)  Penetration/Aspiration details (puree) (None)  Pharyngeal - Mechanical Soft (None)  Penetration/Aspiration details (mechanical soft) (None)  Pharyngeal - Regular (None)  Penetration/Aspiration details (regular) (None)  Pharyngeal - Multi-consistency (None)  Penetration/Aspiration details (multi-consistency) (None)  Pharyngeal - Pill (None)  Penetration/Aspiration details (pill) (None)  Pharyngeal Comment (None)      CHL IP CERVICAL ESOPHAGEAL PHASE 01/03/2015  Cervical Esophageal Phase Impaired  Pudding Teaspoon (None)  Pudding Cup (None)  Honey Teaspoon (None)  Honey Cup (None)  Honey Straw (None)  Nectar  Teaspoon (None)  Nectar Cup (None)  Nectar Straw (None)  Nectar Sippy Cup (None)  Thin Teaspoon (None)  Thin Cup Reduced cricopharyngeal relaxation;Prominent cricopharyngeal segment  Thin Straw Reduced cricopharyngeal relaxation;Prominent cricopharyngeal segment  Thin Sippy Cup (None)  Cervical Esophageal Comment (None)    CHL IP GO 01/03/2015  Functional Assessment Tool Used skilled clinical judgment  Functional Limitations Swallowing  Swallow Current Status (Z0092) CI  Swallow Goal Status (Z3007) CI  Swallow Discharge Status (M2263) CI  Motor Speech Current Status (F3545) (None)  Motor Speech Goal Status (G2563) (None)  Motor Speech Goal Status (S9373) (None)  Spoken Language Comprehension Current Status (S2876) (None)  Spoken Language Comprehension Goal Status (O1157) (None)  Spoken Language Comprehension Discharge Status (W6203) (None)  Spoken Language Expression Current Status (T5974) (None)  Spoken Language Expression Goal Status (B6384) (None)  Spoken Language Expression Discharge Status [phone]) (None)  Attention Current Status (O0321) (None)  Attention Goal Status (Y2482) (None)  Attention Discharge Status (N0037) (None)  Memory Current Status (C4888) (None)  Memory Goal Status (B1694) (None)  Memory Discharge Status (H0388) (None)  Voice Current Status (E2800) (None)  Voice Goal Status (L4917) (None)  Voice Discharge Status (H1505) (None)  Other Speech-Language Pathology Functional Limitation (W9794) (None)  Other Speech-Language Pathology Functional Limitation Goal Status (I0165) (None)  Other Speech-Language Pathology Functional Limitation Discharge Status [phone]) (None)          Germain Osgood, M.A. CCC-SLP [phone]  Germain Osgood 01/03/2015, 1:28 PM

## 2015-01-15 DIAGNOSIS — R300 Dysuria: Secondary | ICD-10-CM | POA: Diagnosis not present

## 2015-01-15 DIAGNOSIS — N390 Urinary tract infection, site not specified: Secondary | ICD-10-CM | POA: Diagnosis not present

## 2015-02-19 DIAGNOSIS — H6092 Unspecified otitis externa, left ear: Secondary | ICD-10-CM | POA: Diagnosis not present

## 2015-02-19 DIAGNOSIS — H6590 Unspecified nonsuppurative otitis media, unspecified ear: Secondary | ICD-10-CM | POA: Diagnosis not present

## 2015-03-06 ENCOUNTER — Ambulatory Visit
Admission: RE | Admit: 2015-03-06 | Discharge: 2015-03-06 | Disposition: A | Discharge status: Home / Self Care | Payer: Medicare Other | Source: Ambulatory Visit | Attending: Neurosurgery | Admitting: Neurosurgery

## 2015-03-06 DIAGNOSIS — S129XXA Fracture of neck, unspecified, initial encounter: Secondary | ICD-10-CM

## 2015-03-06 DIAGNOSIS — R1312 Dysphagia, oropharyngeal phase: Secondary | ICD-10-CM | POA: Diagnosis not present

## 2015-03-06 DIAGNOSIS — M4322 Fusion of spine, cervical region: Secondary | ICD-10-CM | POA: Diagnosis not present

## 2015-03-06 DIAGNOSIS — M542 Cervicalgia: Secondary | ICD-10-CM | POA: Diagnosis not present

## 2015-03-15 DIAGNOSIS — R1314 Dysphagia, pharyngoesophageal phase: Secondary | ICD-10-CM | POA: Diagnosis not present

## 2015-03-15 DIAGNOSIS — K219 Gastro-esophageal reflux disease without esophagitis: Secondary | ICD-10-CM | POA: Diagnosis not present

## 2015-03-24 DIAGNOSIS — Z23 Encounter for immunization: Secondary | ICD-10-CM | POA: Diagnosis not present

## 2015-04-23 DIAGNOSIS — G43109 Migraine with aura, not intractable, without status migrainosus: Secondary | ICD-10-CM | POA: Diagnosis not present

## 2015-04-23 DIAGNOSIS — M5106 Intervertebral disc disorders with myelopathy, lumbar region: Secondary | ICD-10-CM | POA: Diagnosis not present

## 2015-04-23 DIAGNOSIS — E7800 Pure hypercholesterolemia, unspecified: Secondary | ICD-10-CM | POA: Diagnosis not present

## 2015-05-10 DIAGNOSIS — M5023 Other cervical disc displacement, cervicothoracic region: Secondary | ICD-10-CM | POA: Diagnosis not present

## 2015-05-10 DIAGNOSIS — M542 Cervicalgia: Secondary | ICD-10-CM | POA: Diagnosis not present

## 2015-06-13 DIAGNOSIS — L814 Other melanin hyperpigmentation: Secondary | ICD-10-CM | POA: Diagnosis not present

## 2015-06-13 DIAGNOSIS — L820 Inflamed seborrheic keratosis: Secondary | ICD-10-CM | POA: Diagnosis not present

## 2015-06-13 DIAGNOSIS — L821 Other seborrheic keratosis: Secondary | ICD-10-CM | POA: Diagnosis not present

## 2015-06-13 DIAGNOSIS — D1801 Hemangioma of skin and subcutaneous tissue: Secondary | ICD-10-CM | POA: Diagnosis not present

## 2015-06-13 DIAGNOSIS — Z85828 Personal history of other malignant neoplasm of skin: Secondary | ICD-10-CM | POA: Diagnosis not present

## 2015-08-09 DIAGNOSIS — M542 Cervicalgia: Secondary | ICD-10-CM | POA: Diagnosis not present

## 2015-08-09 DIAGNOSIS — Z6826 Body mass index (BMI) 26.0-26.9, adult: Secondary | ICD-10-CM | POA: Diagnosis not present

## 2015-08-09 DIAGNOSIS — R030 Elevated blood-pressure reading, without diagnosis of hypertension: Secondary | ICD-10-CM | POA: Diagnosis not present

## 2015-08-09 DIAGNOSIS — M5023 Other cervical disc displacement, cervicothoracic region: Secondary | ICD-10-CM | POA: Diagnosis not present

## 2015-08-16 DIAGNOSIS — M542 Cervicalgia: Secondary | ICD-10-CM | POA: Diagnosis not present

## 2015-08-23 DIAGNOSIS — M542 Cervicalgia: Secondary | ICD-10-CM | POA: Diagnosis not present

## 2015-08-29 DIAGNOSIS — M542 Cervicalgia: Secondary | ICD-10-CM | POA: Diagnosis not present

## 2015-09-05 DIAGNOSIS — M542 Cervicalgia: Secondary | ICD-10-CM | POA: Diagnosis not present

## 2015-09-18 DIAGNOSIS — M542 Cervicalgia: Secondary | ICD-10-CM | POA: Diagnosis not present

## 2015-09-20 DIAGNOSIS — M5137 Other intervertebral disc degeneration, lumbosacral region: Secondary | ICD-10-CM | POA: Diagnosis not present

## 2015-10-18 DIAGNOSIS — Z209 Contact with and (suspected) exposure to unspecified communicable disease: Secondary | ICD-10-CM | POA: Diagnosis not present

## 2015-10-18 DIAGNOSIS — M5137 Other intervertebral disc degeneration, lumbosacral region: Secondary | ICD-10-CM | POA: Diagnosis not present

## 2015-10-18 DIAGNOSIS — E7800 Pure hypercholesterolemia, unspecified: Secondary | ICD-10-CM | POA: Diagnosis not present

## 2015-10-18 DIAGNOSIS — M5126 Other intervertebral disc displacement, lumbar region: Secondary | ICD-10-CM | POA: Diagnosis not present

## 2015-10-18 DIAGNOSIS — Z79899 Other long term (current) drug therapy: Secondary | ICD-10-CM | POA: Diagnosis not present

## 2015-10-19 DIAGNOSIS — M5126 Other intervertebral disc displacement, lumbar region: Secondary | ICD-10-CM | POA: Diagnosis not present

## 2015-10-23 DIAGNOSIS — M5137 Other intervertebral disc degeneration, lumbosacral region: Secondary | ICD-10-CM | POA: Diagnosis not present

## 2015-10-24 DIAGNOSIS — N952 Postmenopausal atrophic vaginitis: Secondary | ICD-10-CM | POA: Diagnosis not present

## 2015-10-24 DIAGNOSIS — M5106 Intervertebral disc disorders with myelopathy, lumbar region: Secondary | ICD-10-CM | POA: Diagnosis not present

## 2015-10-24 DIAGNOSIS — Z0000 Encounter for general adult medical examination without abnormal findings: Secondary | ICD-10-CM | POA: Diagnosis not present

## 2015-10-24 DIAGNOSIS — Z7189 Other specified counseling: Secondary | ICD-10-CM | POA: Diagnosis not present

## 2015-10-24 DIAGNOSIS — G43109 Migraine with aura, not intractable, without status migrainosus: Secondary | ICD-10-CM | POA: Diagnosis not present

## 2015-10-24 DIAGNOSIS — Z23 Encounter for immunization: Secondary | ICD-10-CM | POA: Diagnosis not present

## 2015-10-24 DIAGNOSIS — E7800 Pure hypercholesterolemia, unspecified: Secondary | ICD-10-CM | POA: Diagnosis not present

## 2015-10-26 DIAGNOSIS — M542 Cervicalgia: Secondary | ICD-10-CM | POA: Diagnosis not present

## 2015-10-26 DIAGNOSIS — M5126 Other intervertebral disc displacement, lumbar region: Secondary | ICD-10-CM | POA: Diagnosis not present

## 2015-11-02 DIAGNOSIS — M4316 Spondylolisthesis, lumbar region: Secondary | ICD-10-CM | POA: Diagnosis not present

## 2015-11-02 DIAGNOSIS — M47816 Spondylosis without myelopathy or radiculopathy, lumbar region: Secondary | ICD-10-CM | POA: Diagnosis not present

## 2015-11-13 DIAGNOSIS — M5137 Other intervertebral disc degeneration, lumbosacral region: Secondary | ICD-10-CM | POA: Diagnosis not present

## 2015-12-18 DIAGNOSIS — H02831 Dermatochalasis of right upper eyelid: Secondary | ICD-10-CM | POA: Diagnosis not present

## 2015-12-18 DIAGNOSIS — R51 Headache: Secondary | ICD-10-CM | POA: Diagnosis not present

## 2015-12-18 DIAGNOSIS — H524 Presbyopia: Secondary | ICD-10-CM | POA: Diagnosis not present

## 2015-12-18 DIAGNOSIS — H5711 Ocular pain, right eye: Secondary | ICD-10-CM | POA: Diagnosis not present

## 2015-12-18 DIAGNOSIS — H02834 Dermatochalasis of left upper eyelid: Secondary | ICD-10-CM | POA: Diagnosis not present

## 2015-12-18 DIAGNOSIS — H5213 Myopia, bilateral: Secondary | ICD-10-CM | POA: Diagnosis not present

## 2015-12-18 DIAGNOSIS — H2513 Age-related nuclear cataract, bilateral: Secondary | ICD-10-CM | POA: Diagnosis not present

## 2015-12-27 DIAGNOSIS — M542 Cervicalgia: Secondary | ICD-10-CM | POA: Diagnosis not present

## 2015-12-27 DIAGNOSIS — R51 Headache: Secondary | ICD-10-CM | POA: Diagnosis not present

## 2015-12-27 DIAGNOSIS — M5126 Other intervertebral disc displacement, lumbar region: Secondary | ICD-10-CM | POA: Diagnosis not present

## 2016-01-01 DIAGNOSIS — M542 Cervicalgia: Secondary | ICD-10-CM | POA: Diagnosis not present

## 2016-01-01 DIAGNOSIS — M5126 Other intervertebral disc displacement, lumbar region: Secondary | ICD-10-CM | POA: Diagnosis not present

## 2016-02-04 DIAGNOSIS — H02051 Trichiasis without entropian right upper eyelid: Secondary | ICD-10-CM | POA: Diagnosis not present

## 2016-02-04 DIAGNOSIS — H02831 Dermatochalasis of right upper eyelid: Secondary | ICD-10-CM | POA: Diagnosis not present

## 2016-02-04 DIAGNOSIS — H0279 Other degenerative disorders of eyelid and periocular area: Secondary | ICD-10-CM | POA: Diagnosis not present

## 2016-02-04 DIAGNOSIS — H02834 Dermatochalasis of left upper eyelid: Secondary | ICD-10-CM | POA: Diagnosis not present

## 2016-02-04 DIAGNOSIS — H53483 Generalized contraction of visual field, bilateral: Secondary | ICD-10-CM | POA: Diagnosis not present

## 2016-02-04 DIAGNOSIS — H02054 Trichiasis without entropian left upper eyelid: Secondary | ICD-10-CM | POA: Diagnosis not present

## 2016-02-20 DIAGNOSIS — Z23 Encounter for immunization: Secondary | ICD-10-CM | POA: Diagnosis not present

## 2016-03-10 DIAGNOSIS — Z1231 Encounter for screening mammogram for malignant neoplasm of breast: Secondary | ICD-10-CM | POA: Diagnosis not present

## 2016-03-10 DIAGNOSIS — Z803 Family history of malignant neoplasm of breast: Secondary | ICD-10-CM | POA: Diagnosis not present

## 2016-04-28 DIAGNOSIS — L821 Other seborrheic keratosis: Secondary | ICD-10-CM | POA: Diagnosis not present

## 2016-04-28 DIAGNOSIS — H02034 Senile entropion of left upper eyelid: Secondary | ICD-10-CM | POA: Diagnosis not present

## 2016-04-28 DIAGNOSIS — H02024 Mechanical entropion of left upper eyelid: Secondary | ICD-10-CM | POA: Diagnosis not present

## 2016-04-28 DIAGNOSIS — H02834 Dermatochalasis of left upper eyelid: Secondary | ICD-10-CM | POA: Diagnosis not present

## 2016-04-28 DIAGNOSIS — H02021 Mechanical entropion of right upper eyelid: Secondary | ICD-10-CM | POA: Diagnosis not present

## 2016-04-28 DIAGNOSIS — H02831 Dermatochalasis of right upper eyelid: Secondary | ICD-10-CM | POA: Diagnosis not present

## 2016-04-28 DIAGNOSIS — H02413 Mechanical ptosis of bilateral eyelids: Secondary | ICD-10-CM | POA: Diagnosis not present

## 2016-04-28 DIAGNOSIS — H02031 Senile entropion of right upper eyelid: Secondary | ICD-10-CM | POA: Diagnosis not present

## 2016-07-18 DIAGNOSIS — H04332 Acute lacrimal canaliculitis of left lacrimal passage: Secondary | ICD-10-CM | POA: Diagnosis not present

## 2016-07-25 DIAGNOSIS — H02834 Dermatochalasis of left upper eyelid: Secondary | ICD-10-CM | POA: Diagnosis not present

## 2016-07-25 DIAGNOSIS — H5213 Myopia, bilateral: Secondary | ICD-10-CM | POA: Diagnosis not present

## 2016-07-25 DIAGNOSIS — H02831 Dermatochalasis of right upper eyelid: Secondary | ICD-10-CM | POA: Diagnosis not present

## 2016-07-25 DIAGNOSIS — H02832 Dermatochalasis of right lower eyelid: Secondary | ICD-10-CM | POA: Diagnosis not present

## 2016-07-25 DIAGNOSIS — H04123 Dry eye syndrome of bilateral lacrimal glands: Secondary | ICD-10-CM | POA: Diagnosis not present

## 2016-07-25 DIAGNOSIS — H02835 Dermatochalasis of left lower eyelid: Secondary | ICD-10-CM | POA: Diagnosis not present

## 2016-07-31 DIAGNOSIS — Z09 Encounter for follow-up examination after completed treatment for conditions other than malignant neoplasm: Secondary | ICD-10-CM | POA: Diagnosis not present

## 2016-07-31 DIAGNOSIS — H05122 Orbital myositis, left orbit: Secondary | ICD-10-CM | POA: Diagnosis not present

## 2016-08-14 DIAGNOSIS — H05122 Orbital myositis, left orbit: Secondary | ICD-10-CM | POA: Diagnosis not present

## 2016-09-22 DIAGNOSIS — Z6826 Body mass index (BMI) 26.0-26.9, adult: Secondary | ICD-10-CM | POA: Diagnosis not present

## 2016-09-22 DIAGNOSIS — R030 Elevated blood-pressure reading, without diagnosis of hypertension: Secondary | ICD-10-CM | POA: Diagnosis not present

## 2016-09-22 DIAGNOSIS — M5137 Other intervertebral disc degeneration, lumbosacral region: Secondary | ICD-10-CM | POA: Diagnosis not present

## 2016-09-25 DIAGNOSIS — M545 Low back pain: Secondary | ICD-10-CM | POA: Diagnosis not present

## 2016-10-06 DIAGNOSIS — H05122 Orbital myositis, left orbit: Secondary | ICD-10-CM | POA: Diagnosis not present

## 2016-10-06 DIAGNOSIS — M4316 Spondylolisthesis, lumbar region: Secondary | ICD-10-CM | POA: Diagnosis not present

## 2016-10-07 DIAGNOSIS — M47817 Spondylosis without myelopathy or radiculopathy, lumbosacral region: Secondary | ICD-10-CM | POA: Diagnosis not present

## 2016-10-07 DIAGNOSIS — M47816 Spondylosis without myelopathy or radiculopathy, lumbar region: Secondary | ICD-10-CM | POA: Diagnosis not present

## 2016-11-10 DIAGNOSIS — M542 Cervicalgia: Secondary | ICD-10-CM | POA: Diagnosis not present

## 2016-11-10 DIAGNOSIS — R221 Localized swelling, mass and lump, neck: Secondary | ICD-10-CM | POA: Diagnosis not present

## 2016-11-12 ENCOUNTER — Other Ambulatory Visit: Payer: Self-pay | Admitting: Neurosurgery

## 2016-11-12 DIAGNOSIS — R221 Localized swelling, mass and lump, neck: Secondary | ICD-10-CM

## 2016-11-18 ENCOUNTER — Ambulatory Visit
Admission: RE | Admit: 2016-11-18 | Discharge: 2016-11-18 | Disposition: A | Discharge status: Home / Self Care | Payer: Medicare Other | Source: Ambulatory Visit | Attending: Neurosurgery | Admitting: Neurosurgery

## 2016-11-18 DIAGNOSIS — R221 Localized swelling, mass and lump, neck: Secondary | ICD-10-CM | POA: Diagnosis not present

## 2016-11-18 MED ORDER — IOPAMIDOL (ISOVUE-300) INJECTION 61%
75.0000 mL | Freq: Once | INTRAVENOUS | Status: AC | PRN
  Administered 2016-11-18: 75 mL via INTRAVENOUS

## 2016-11-24 DIAGNOSIS — M542 Cervicalgia: Secondary | ICD-10-CM | POA: Diagnosis not present

## 2016-11-24 DIAGNOSIS — R030 Elevated blood-pressure reading, without diagnosis of hypertension: Secondary | ICD-10-CM | POA: Diagnosis not present

## 2016-11-24 DIAGNOSIS — Z6825 Body mass index (BMI) 25.0-25.9, adult: Secondary | ICD-10-CM | POA: Diagnosis not present

## 2016-12-16 DIAGNOSIS — M47812 Spondylosis without myelopathy or radiculopathy, cervical region: Secondary | ICD-10-CM | POA: Diagnosis not present

## 2016-12-16 DIAGNOSIS — R51 Headache: Secondary | ICD-10-CM | POA: Diagnosis not present

## 2016-12-24 DIAGNOSIS — H02835 Dermatochalasis of left lower eyelid: Secondary | ICD-10-CM | POA: Diagnosis not present

## 2016-12-24 DIAGNOSIS — H5213 Myopia, bilateral: Secondary | ICD-10-CM | POA: Diagnosis not present

## 2016-12-24 DIAGNOSIS — H02831 Dermatochalasis of right upper eyelid: Secondary | ICD-10-CM | POA: Diagnosis not present

## 2016-12-24 DIAGNOSIS — H02832 Dermatochalasis of right lower eyelid: Secondary | ICD-10-CM | POA: Diagnosis not present

## 2016-12-24 DIAGNOSIS — H02834 Dermatochalasis of left upper eyelid: Secondary | ICD-10-CM | POA: Diagnosis not present

## 2017-01-01 DIAGNOSIS — Z79899 Other long term (current) drug therapy: Secondary | ICD-10-CM | POA: Diagnosis not present

## 2017-01-01 DIAGNOSIS — R8290 Unspecified abnormal findings in urine: Secondary | ICD-10-CM | POA: Diagnosis not present

## 2017-01-01 DIAGNOSIS — M899 Disorder of bone, unspecified: Secondary | ICD-10-CM | POA: Diagnosis not present

## 2017-01-01 DIAGNOSIS — E7800 Pure hypercholesterolemia, unspecified: Secondary | ICD-10-CM | POA: Diagnosis not present

## 2017-01-06 DIAGNOSIS — M542 Cervicalgia: Secondary | ICD-10-CM | POA: Diagnosis not present

## 2017-01-07 DIAGNOSIS — N952 Postmenopausal atrophic vaginitis: Secondary | ICD-10-CM | POA: Diagnosis not present

## 2017-01-07 DIAGNOSIS — M5106 Intervertebral disc disorders with myelopathy, lumbar region: Secondary | ICD-10-CM | POA: Diagnosis not present

## 2017-01-07 DIAGNOSIS — Z0000 Encounter for general adult medical examination without abnormal findings: Secondary | ICD-10-CM | POA: Diagnosis not present

## 2017-01-07 DIAGNOSIS — G43109 Migraine with aura, not intractable, without status migrainosus: Secondary | ICD-10-CM | POA: Diagnosis not present

## 2017-01-07 DIAGNOSIS — E7800 Pure hypercholesterolemia, unspecified: Secondary | ICD-10-CM | POA: Diagnosis not present

## 2017-01-07 DIAGNOSIS — M5090 Cervical disc disorder, unspecified, unspecified cervical region: Secondary | ICD-10-CM | POA: Diagnosis not present

## 2017-01-09 DIAGNOSIS — D2262 Melanocytic nevi of left upper limb, including shoulder: Secondary | ICD-10-CM | POA: Diagnosis not present

## 2017-01-09 DIAGNOSIS — L814 Other melanin hyperpigmentation: Secondary | ICD-10-CM | POA: Diagnosis not present

## 2017-01-09 DIAGNOSIS — Z85828 Personal history of other malignant neoplasm of skin: Secondary | ICD-10-CM | POA: Diagnosis not present

## 2017-01-09 DIAGNOSIS — D225 Melanocytic nevi of trunk: Secondary | ICD-10-CM | POA: Diagnosis not present

## 2017-01-09 DIAGNOSIS — D1801 Hemangioma of skin and subcutaneous tissue: Secondary | ICD-10-CM | POA: Diagnosis not present

## 2017-01-09 DIAGNOSIS — D2271 Melanocytic nevi of right lower limb, including hip: Secondary | ICD-10-CM | POA: Diagnosis not present

## 2017-01-09 DIAGNOSIS — D2272 Melanocytic nevi of left lower limb, including hip: Secondary | ICD-10-CM | POA: Diagnosis not present

## 2017-01-09 DIAGNOSIS — L821 Other seborrheic keratosis: Secondary | ICD-10-CM | POA: Diagnosis not present

## 2017-02-02 DIAGNOSIS — M5126 Other intervertebral disc displacement, lumbar region: Secondary | ICD-10-CM | POA: Diagnosis not present

## 2017-02-02 DIAGNOSIS — M542 Cervicalgia: Secondary | ICD-10-CM | POA: Diagnosis not present

## 2017-02-05 DIAGNOSIS — M5126 Other intervertebral disc displacement, lumbar region: Secondary | ICD-10-CM | POA: Diagnosis not present

## 2017-02-05 DIAGNOSIS — M542 Cervicalgia: Secondary | ICD-10-CM | POA: Diagnosis not present

## 2017-02-10 DIAGNOSIS — M542 Cervicalgia: Secondary | ICD-10-CM | POA: Diagnosis not present

## 2017-02-10 DIAGNOSIS — M5126 Other intervertebral disc displacement, lumbar region: Secondary | ICD-10-CM | POA: Diagnosis not present

## 2017-02-12 DIAGNOSIS — M47812 Spondylosis without myelopathy or radiculopathy, cervical region: Secondary | ICD-10-CM | POA: Diagnosis not present

## 2017-02-16 DIAGNOSIS — H05122 Orbital myositis, left orbit: Secondary | ICD-10-CM | POA: Diagnosis not present

## 2017-02-16 DIAGNOSIS — Z23 Encounter for immunization: Secondary | ICD-10-CM | POA: Diagnosis not present

## 2017-02-20 DIAGNOSIS — M5126 Other intervertebral disc displacement, lumbar region: Secondary | ICD-10-CM | POA: Diagnosis not present

## 2017-02-20 DIAGNOSIS — M542 Cervicalgia: Secondary | ICD-10-CM | POA: Diagnosis not present

## 2017-02-27 DIAGNOSIS — M5126 Other intervertebral disc displacement, lumbar region: Secondary | ICD-10-CM | POA: Diagnosis not present

## 2017-02-27 DIAGNOSIS — M542 Cervicalgia: Secondary | ICD-10-CM | POA: Diagnosis not present

## 2017-03-02 DIAGNOSIS — M47812 Spondylosis without myelopathy or radiculopathy, cervical region: Secondary | ICD-10-CM | POA: Diagnosis not present

## 2017-03-04 DIAGNOSIS — M542 Cervicalgia: Secondary | ICD-10-CM | POA: Diagnosis not present

## 2017-03-04 DIAGNOSIS — M5126 Other intervertebral disc displacement, lumbar region: Secondary | ICD-10-CM | POA: Diagnosis not present

## 2017-03-18 DIAGNOSIS — M542 Cervicalgia: Secondary | ICD-10-CM | POA: Diagnosis not present

## 2017-03-18 DIAGNOSIS — M5126 Other intervertebral disc displacement, lumbar region: Secondary | ICD-10-CM | POA: Diagnosis not present

## 2017-03-19 DIAGNOSIS — N390 Urinary tract infection, site not specified: Secondary | ICD-10-CM | POA: Diagnosis not present

## 2017-03-26 DIAGNOSIS — M542 Cervicalgia: Secondary | ICD-10-CM | POA: Diagnosis not present

## 2017-03-26 DIAGNOSIS — M5126 Other intervertebral disc displacement, lumbar region: Secondary | ICD-10-CM | POA: Diagnosis not present

## 2017-04-08 DIAGNOSIS — M5126 Other intervertebral disc displacement, lumbar region: Secondary | ICD-10-CM | POA: Diagnosis not present

## 2017-04-08 DIAGNOSIS — M542 Cervicalgia: Secondary | ICD-10-CM | POA: Diagnosis not present

## 2017-05-28 DIAGNOSIS — M5440 Lumbago with sciatica, unspecified side: Secondary | ICD-10-CM | POA: Diagnosis not present

## 2017-05-28 DIAGNOSIS — Z6826 Body mass index (BMI) 26.0-26.9, adult: Secondary | ICD-10-CM | POA: Diagnosis not present

## 2017-05-28 DIAGNOSIS — R030 Elevated blood-pressure reading, without diagnosis of hypertension: Secondary | ICD-10-CM | POA: Diagnosis not present

## 2017-05-29 ENCOUNTER — Other Ambulatory Visit: Payer: Self-pay | Admitting: Neurosurgery

## 2017-05-29 DIAGNOSIS — M5440 Lumbago with sciatica, unspecified side: Secondary | ICD-10-CM

## 2017-06-11 DIAGNOSIS — M5126 Other intervertebral disc displacement, lumbar region: Secondary | ICD-10-CM | POA: Diagnosis not present

## 2017-06-11 DIAGNOSIS — M542 Cervicalgia: Secondary | ICD-10-CM | POA: Diagnosis not present

## 2017-06-15 ENCOUNTER — Ambulatory Visit
Admission: RE | Admit: 2017-06-15 | Discharge: 2017-06-15 | Disposition: A | Discharge status: Home / Self Care | Payer: Medicare Other | Source: Ambulatory Visit | Attending: Neurosurgery | Admitting: Neurosurgery

## 2017-06-15 DIAGNOSIS — Z803 Family history of malignant neoplasm of breast: Secondary | ICD-10-CM | POA: Diagnosis not present

## 2017-06-15 DIAGNOSIS — M5116 Intervertebral disc disorders with radiculopathy, lumbar region: Secondary | ICD-10-CM | POA: Diagnosis not present

## 2017-06-15 DIAGNOSIS — M5440 Lumbago with sciatica, unspecified side: Secondary | ICD-10-CM

## 2017-06-15 DIAGNOSIS — Z1231 Encounter for screening mammogram for malignant neoplasm of breast: Secondary | ICD-10-CM | POA: Diagnosis not present

## 2017-06-15 MED ORDER — METHYLPREDNISOLONE ACETATE 40 MG/ML INJ SUSP (RADIOLOG
120.0000 mg | Freq: Once | INTRAMUSCULAR | Status: AC
  Administered 2017-06-15: 120 mg via EPIDURAL

## 2017-06-15 MED ORDER — IOPAMIDOL (ISOVUE-M 200) INJECTION 41%
1.0000 mL | Freq: Once | INTRAMUSCULAR | Status: AC
  Administered 2017-06-15: 1 mL via EPIDURAL

## 2017-06-15 NOTE — Discharge Instructions (Signed)

## 2017-07-03 DIAGNOSIS — J209 Acute bronchitis, unspecified: Secondary | ICD-10-CM | POA: Diagnosis not present

## 2017-07-03 DIAGNOSIS — R079 Chest pain, unspecified: Secondary | ICD-10-CM | POA: Diagnosis not present

## 2017-07-03 DIAGNOSIS — R05 Cough: Secondary | ICD-10-CM | POA: Diagnosis not present

## 2017-07-07 DIAGNOSIS — M47816 Spondylosis without myelopathy or radiculopathy, lumbar region: Secondary | ICD-10-CM | POA: Diagnosis not present

## 2017-07-07 DIAGNOSIS — M542 Cervicalgia: Secondary | ICD-10-CM | POA: Diagnosis not present

## 2017-07-14 ENCOUNTER — Other Ambulatory Visit: Payer: Self-pay | Admitting: Student

## 2017-07-14 DIAGNOSIS — M5440 Lumbago with sciatica, unspecified side: Secondary | ICD-10-CM

## 2017-07-28 DIAGNOSIS — H60392 Other infective otitis externa, left ear: Secondary | ICD-10-CM | POA: Diagnosis not present

## 2017-07-29 DIAGNOSIS — M542 Cervicalgia: Secondary | ICD-10-CM | POA: Diagnosis not present

## 2017-07-29 DIAGNOSIS — M5126 Other intervertebral disc displacement, lumbar region: Secondary | ICD-10-CM | POA: Diagnosis not present

## 2017-07-30 DIAGNOSIS — H60392 Other infective otitis externa, left ear: Secondary | ICD-10-CM | POA: Diagnosis not present

## 2017-08-03 ENCOUNTER — Other Ambulatory Visit: Payer: Medicare Other

## 2017-08-17 ENCOUNTER — Other Ambulatory Visit: Payer: Medicare Other

## 2017-08-20 ENCOUNTER — Ambulatory Visit
Admission: RE | Admit: 2017-08-20 | Discharge: 2017-08-20 | Disposition: A | Discharge status: Home / Self Care | Payer: Medicare Other | Source: Ambulatory Visit | Attending: Student | Admitting: Student

## 2017-08-20 DIAGNOSIS — M5440 Lumbago with sciatica, unspecified side: Secondary | ICD-10-CM

## 2017-08-20 DIAGNOSIS — M545 Low back pain: Secondary | ICD-10-CM | POA: Diagnosis not present

## 2017-08-20 MED ORDER — IOPAMIDOL (ISOVUE-M 200) INJECTION 41%
1.0000 mL | Freq: Once | INTRAMUSCULAR | Status: AC
  Administered 2017-08-20: 1 mL via INTRA_ARTICULAR

## 2017-08-20 MED ORDER — METHYLPREDNISOLONE ACETATE 40 MG/ML INJ SUSP (RADIOLOG
120.0000 mg | Freq: Once | INTRAMUSCULAR | Status: AC
  Administered 2017-08-20: 120 mg via INTRA_ARTICULAR

## 2017-08-20 NOTE — Discharge Instructions (Signed)

## 2017-09-08 DIAGNOSIS — M5440 Lumbago with sciatica, unspecified side: Secondary | ICD-10-CM | POA: Diagnosis not present

## 2017-09-08 DIAGNOSIS — M4316 Spondylolisthesis, lumbar region: Secondary | ICD-10-CM | POA: Diagnosis not present

## 2017-09-09 ENCOUNTER — Other Ambulatory Visit: Payer: Self-pay | Admitting: Neurosurgery

## 2017-09-09 DIAGNOSIS — M4316 Spondylolisthesis, lumbar region: Secondary | ICD-10-CM

## 2017-09-24 ENCOUNTER — Ambulatory Visit
Admission: RE | Admit: 2017-09-24 | Discharge: 2017-09-24 | Disposition: A | Discharge status: Home / Self Care | Payer: Medicare Other | Source: Ambulatory Visit | Attending: Neurosurgery | Admitting: Neurosurgery

## 2017-09-24 DIAGNOSIS — M4316 Spondylolisthesis, lumbar region: Secondary | ICD-10-CM

## 2017-09-24 DIAGNOSIS — M545 Low back pain: Secondary | ICD-10-CM | POA: Diagnosis not present

## 2017-09-24 MED ORDER — METHYLPREDNISOLONE ACETATE 40 MG/ML INJ SUSP (RADIOLOG
120.0000 mg | Freq: Once | INTRAMUSCULAR | Status: AC
  Administered 2017-09-24: 120 mg via INTRA_ARTICULAR

## 2017-09-24 MED ORDER — IOPAMIDOL (ISOVUE-M 200) INJECTION 41%
1.0000 mL | Freq: Once | INTRAMUSCULAR | Status: AC
  Administered 2017-09-24: 1 mL via INTRA_ARTICULAR

## 2017-10-12 DIAGNOSIS — R8290 Unspecified abnormal findings in urine: Secondary | ICD-10-CM | POA: Diagnosis not present

## 2017-10-12 DIAGNOSIS — R030 Elevated blood-pressure reading, without diagnosis of hypertension: Secondary | ICD-10-CM | POA: Diagnosis not present

## 2017-10-12 DIAGNOSIS — G4762 Sleep related leg cramps: Secondary | ICD-10-CM | POA: Diagnosis not present

## 2017-10-15 DIAGNOSIS — Z6826 Body mass index (BMI) 26.0-26.9, adult: Secondary | ICD-10-CM | POA: Diagnosis not present

## 2017-10-15 DIAGNOSIS — R030 Elevated blood-pressure reading, without diagnosis of hypertension: Secondary | ICD-10-CM | POA: Diagnosis not present

## 2017-10-15 DIAGNOSIS — M4316 Spondylolisthesis, lumbar region: Secondary | ICD-10-CM | POA: Diagnosis not present

## 2017-10-19 DIAGNOSIS — Z6826 Body mass index (BMI) 26.0-26.9, adult: Secondary | ICD-10-CM | POA: Diagnosis not present

## 2017-10-19 DIAGNOSIS — R8290 Unspecified abnormal findings in urine: Secondary | ICD-10-CM | POA: Diagnosis not present

## 2017-12-04 DIAGNOSIS — K529 Noninfective gastroenteritis and colitis, unspecified: Secondary | ICD-10-CM | POA: Diagnosis not present

## 2017-12-04 DIAGNOSIS — Z6826 Body mass index (BMI) 26.0-26.9, adult: Secondary | ICD-10-CM | POA: Diagnosis not present

## 2017-12-08 DIAGNOSIS — M542 Cervicalgia: Secondary | ICD-10-CM | POA: Diagnosis not present

## 2017-12-08 DIAGNOSIS — M5126 Other intervertebral disc displacement, lumbar region: Secondary | ICD-10-CM | POA: Diagnosis not present

## 2017-12-17 DIAGNOSIS — M5126 Other intervertebral disc displacement, lumbar region: Secondary | ICD-10-CM | POA: Diagnosis not present

## 2017-12-17 DIAGNOSIS — M542 Cervicalgia: Secondary | ICD-10-CM | POA: Diagnosis not present

## 2017-12-22 DIAGNOSIS — M542 Cervicalgia: Secondary | ICD-10-CM | POA: Diagnosis not present

## 2017-12-22 DIAGNOSIS — M5126 Other intervertebral disc displacement, lumbar region: Secondary | ICD-10-CM | POA: Diagnosis not present

## 2017-12-25 DIAGNOSIS — H04123 Dry eye syndrome of bilateral lacrimal glands: Secondary | ICD-10-CM | POA: Diagnosis not present

## 2017-12-25 DIAGNOSIS — R51 Headache: Secondary | ICD-10-CM | POA: Diagnosis not present

## 2017-12-25 DIAGNOSIS — H524 Presbyopia: Secondary | ICD-10-CM | POA: Diagnosis not present

## 2017-12-25 DIAGNOSIS — H43813 Vitreous degeneration, bilateral: Secondary | ICD-10-CM | POA: Diagnosis not present

## 2017-12-25 DIAGNOSIS — H5213 Myopia, bilateral: Secondary | ICD-10-CM | POA: Diagnosis not present

## 2017-12-25 DIAGNOSIS — H2513 Age-related nuclear cataract, bilateral: Secondary | ICD-10-CM | POA: Diagnosis not present

## 2018-01-04 DIAGNOSIS — M542 Cervicalgia: Secondary | ICD-10-CM | POA: Diagnosis not present

## 2018-01-04 DIAGNOSIS — M5126 Other intervertebral disc displacement, lumbar region: Secondary | ICD-10-CM | POA: Diagnosis not present

## 2018-01-18 DIAGNOSIS — M25551 Pain in right hip: Secondary | ICD-10-CM | POA: Diagnosis not present

## 2018-01-18 DIAGNOSIS — Z6825 Body mass index (BMI) 25.0-25.9, adult: Secondary | ICD-10-CM | POA: Diagnosis not present

## 2018-01-18 DIAGNOSIS — M5106 Intervertebral disc disorders with myelopathy, lumbar region: Secondary | ICD-10-CM | POA: Diagnosis not present

## 2018-01-18 DIAGNOSIS — Z79899 Other long term (current) drug therapy: Secondary | ICD-10-CM | POA: Diagnosis not present

## 2018-01-18 DIAGNOSIS — E7800 Pure hypercholesterolemia, unspecified: Secondary | ICD-10-CM | POA: Diagnosis not present

## 2018-01-18 DIAGNOSIS — M5090 Cervical disc disorder, unspecified, unspecified cervical region: Secondary | ICD-10-CM | POA: Diagnosis not present

## 2018-02-09 DIAGNOSIS — M542 Cervicalgia: Secondary | ICD-10-CM | POA: Diagnosis not present

## 2018-02-09 DIAGNOSIS — M5126 Other intervertebral disc displacement, lumbar region: Secondary | ICD-10-CM | POA: Diagnosis not present

## 2018-02-18 DIAGNOSIS — H43813 Vitreous degeneration, bilateral: Secondary | ICD-10-CM | POA: Diagnosis not present

## 2018-02-18 DIAGNOSIS — H16102 Unspecified superficial keratitis, left eye: Secondary | ICD-10-CM | POA: Diagnosis not present

## 2018-02-18 DIAGNOSIS — H04123 Dry eye syndrome of bilateral lacrimal glands: Secondary | ICD-10-CM | POA: Diagnosis not present

## 2018-02-18 DIAGNOSIS — R51 Headache: Secondary | ICD-10-CM | POA: Diagnosis not present

## 2018-02-23 DIAGNOSIS — R030 Elevated blood-pressure reading, without diagnosis of hypertension: Secondary | ICD-10-CM | POA: Diagnosis not present

## 2018-02-23 DIAGNOSIS — M542 Cervicalgia: Secondary | ICD-10-CM | POA: Diagnosis not present

## 2018-02-23 DIAGNOSIS — M5440 Lumbago with sciatica, unspecified side: Secondary | ICD-10-CM | POA: Diagnosis not present

## 2018-02-23 DIAGNOSIS — Z6825 Body mass index (BMI) 25.0-25.9, adult: Secondary | ICD-10-CM | POA: Diagnosis not present

## 2018-03-17 DIAGNOSIS — Z6826 Body mass index (BMI) 26.0-26.9, adult: Secondary | ICD-10-CM | POA: Diagnosis not present

## 2018-03-17 DIAGNOSIS — E7800 Pure hypercholesterolemia, unspecified: Secondary | ICD-10-CM | POA: Diagnosis not present

## 2018-03-17 DIAGNOSIS — Z23 Encounter for immunization: Secondary | ICD-10-CM | POA: Diagnosis not present

## 2018-03-17 DIAGNOSIS — M5106 Intervertebral disc disorders with myelopathy, lumbar region: Secondary | ICD-10-CM | POA: Diagnosis not present

## 2018-03-17 DIAGNOSIS — Z1389 Encounter for screening for other disorder: Secondary | ICD-10-CM | POA: Diagnosis not present

## 2018-03-17 DIAGNOSIS — Z780 Asymptomatic menopausal state: Secondary | ICD-10-CM | POA: Diagnosis not present

## 2018-03-17 DIAGNOSIS — Z0000 Encounter for general adult medical examination without abnormal findings: Secondary | ICD-10-CM | POA: Diagnosis not present

## 2018-03-17 DIAGNOSIS — G43109 Migraine with aura, not intractable, without status migrainosus: Secondary | ICD-10-CM | POA: Diagnosis not present

## 2018-04-20 DIAGNOSIS — H01115 Allergic dermatitis of left lower eyelid: Secondary | ICD-10-CM | POA: Diagnosis not present

## 2018-04-20 DIAGNOSIS — H01114 Allergic dermatitis of left upper eyelid: Secondary | ICD-10-CM | POA: Diagnosis not present

## 2018-04-22 DIAGNOSIS — N952 Postmenopausal atrophic vaginitis: Secondary | ICD-10-CM | POA: Diagnosis not present

## 2018-04-22 DIAGNOSIS — R300 Dysuria: Secondary | ICD-10-CM | POA: Diagnosis not present

## 2018-05-13 DIAGNOSIS — M5440 Lumbago with sciatica, unspecified side: Secondary | ICD-10-CM | POA: Diagnosis not present

## 2018-05-13 DIAGNOSIS — M542 Cervicalgia: Secondary | ICD-10-CM | POA: Diagnosis not present

## 2018-05-31 DIAGNOSIS — Z6826 Body mass index (BMI) 26.0-26.9, adult: Secondary | ICD-10-CM | POA: Diagnosis not present

## 2018-05-31 DIAGNOSIS — R1013 Epigastric pain: Secondary | ICD-10-CM | POA: Diagnosis not present

## 2018-06-03 DIAGNOSIS — D225 Melanocytic nevi of trunk: Secondary | ICD-10-CM | POA: Diagnosis not present

## 2018-06-03 DIAGNOSIS — L814 Other melanin hyperpigmentation: Secondary | ICD-10-CM | POA: Diagnosis not present

## 2018-06-03 DIAGNOSIS — L570 Actinic keratosis: Secondary | ICD-10-CM | POA: Diagnosis not present

## 2018-06-03 DIAGNOSIS — D1801 Hemangioma of skin and subcutaneous tissue: Secondary | ICD-10-CM | POA: Diagnosis not present

## 2018-06-03 DIAGNOSIS — L309 Dermatitis, unspecified: Secondary | ICD-10-CM | POA: Diagnosis not present

## 2018-06-03 DIAGNOSIS — L438 Other lichen planus: Secondary | ICD-10-CM | POA: Diagnosis not present

## 2018-06-03 DIAGNOSIS — Z85828 Personal history of other malignant neoplasm of skin: Secondary | ICD-10-CM | POA: Diagnosis not present

## 2018-06-03 DIAGNOSIS — L821 Other seborrheic keratosis: Secondary | ICD-10-CM | POA: Diagnosis not present

## 2018-06-11 ENCOUNTER — Other Ambulatory Visit: Payer: Self-pay | Admitting: Family Medicine

## 2018-06-11 DIAGNOSIS — R1011 Right upper quadrant pain: Secondary | ICD-10-CM

## 2018-06-11 DIAGNOSIS — R1013 Epigastric pain: Secondary | ICD-10-CM

## 2018-06-14 ENCOUNTER — Ambulatory Visit
Admission: RE | Admit: 2018-06-14 | Discharge: 2018-06-14 | Disposition: A | Discharge status: Home / Self Care | Payer: Medicare Other | Source: Ambulatory Visit | Attending: Family Medicine | Admitting: Family Medicine

## 2018-06-14 DIAGNOSIS — R1013 Epigastric pain: Secondary | ICD-10-CM

## 2018-06-14 DIAGNOSIS — K760 Fatty (change of) liver, not elsewhere classified: Secondary | ICD-10-CM | POA: Diagnosis not present

## 2018-06-14 DIAGNOSIS — R1011 Right upper quadrant pain: Secondary | ICD-10-CM

## 2018-06-15 DIAGNOSIS — N816 Rectocele: Secondary | ICD-10-CM | POA: Diagnosis not present

## 2018-06-15 DIAGNOSIS — N952 Postmenopausal atrophic vaginitis: Secondary | ICD-10-CM | POA: Diagnosis not present

## 2018-06-15 DIAGNOSIS — N898 Other specified noninflammatory disorders of vagina: Secondary | ICD-10-CM | POA: Diagnosis not present

## 2018-06-15 DIAGNOSIS — M8580 Other specified disorders of bone density and structure, unspecified site: Secondary | ICD-10-CM | POA: Diagnosis not present

## 2018-06-17 DIAGNOSIS — Z90710 Acquired absence of both cervix and uterus: Secondary | ICD-10-CM | POA: Diagnosis not present

## 2018-06-17 DIAGNOSIS — Z8262 Family history of osteoporosis: Secondary | ICD-10-CM | POA: Diagnosis not present

## 2018-06-17 DIAGNOSIS — Z780 Asymptomatic menopausal state: Secondary | ICD-10-CM | POA: Diagnosis not present

## 2018-06-17 DIAGNOSIS — Z803 Family history of malignant neoplasm of breast: Secondary | ICD-10-CM | POA: Diagnosis not present

## 2018-06-17 DIAGNOSIS — M8589 Other specified disorders of bone density and structure, multiple sites: Secondary | ICD-10-CM | POA: Diagnosis not present

## 2018-06-17 DIAGNOSIS — Z1231 Encounter for screening mammogram for malignant neoplasm of breast: Secondary | ICD-10-CM | POA: Diagnosis not present

## 2018-06-17 DIAGNOSIS — M47816 Spondylosis without myelopathy or radiculopathy, lumbar region: Secondary | ICD-10-CM | POA: Diagnosis not present

## 2018-06-18 ENCOUNTER — Other Ambulatory Visit (HOSPITAL_COMMUNITY): Payer: Self-pay | Admitting: Family Medicine

## 2018-06-18 ENCOUNTER — Other Ambulatory Visit: Payer: Self-pay | Admitting: Family Medicine

## 2018-06-18 DIAGNOSIS — R1013 Epigastric pain: Secondary | ICD-10-CM

## 2018-06-18 DIAGNOSIS — R1011 Right upper quadrant pain: Secondary | ICD-10-CM

## 2018-06-25 ENCOUNTER — Ambulatory Visit (HOSPITAL_COMMUNITY)
Admission: RE | Admit: 2018-06-25 | Discharge: 2018-06-25 | Disposition: A | Discharge status: Home / Self Care | Payer: Medicare Other | Source: Ambulatory Visit | Attending: Family Medicine | Admitting: Family Medicine

## 2018-06-25 DIAGNOSIS — R110 Nausea: Secondary | ICD-10-CM | POA: Diagnosis not present

## 2018-06-25 DIAGNOSIS — R1013 Epigastric pain: Secondary | ICD-10-CM | POA: Insufficient documentation

## 2018-06-25 DIAGNOSIS — R1011 Right upper quadrant pain: Secondary | ICD-10-CM | POA: Diagnosis not present

## 2018-06-25 MED ORDER — TECHNETIUM TC 99M MEBROFENIN IV KIT
5.5000 | PACK | Freq: Once | INTRAVENOUS | Status: AC
  Administered 2018-06-25: 5.5 via INTRAVENOUS

## 2018-07-01 DIAGNOSIS — M47812 Spondylosis without myelopathy or radiculopathy, cervical region: Secondary | ICD-10-CM | POA: Diagnosis not present

## 2018-07-07 DIAGNOSIS — R1013 Epigastric pain: Secondary | ICD-10-CM | POA: Diagnosis not present

## 2018-07-13 DIAGNOSIS — K317 Polyp of stomach and duodenum: Secondary | ICD-10-CM | POA: Diagnosis not present

## 2018-07-13 DIAGNOSIS — K2930 Chronic superficial gastritis without bleeding: Secondary | ICD-10-CM | POA: Diagnosis not present

## 2018-07-13 DIAGNOSIS — R1013 Epigastric pain: Secondary | ICD-10-CM | POA: Diagnosis not present

## 2018-07-19 DIAGNOSIS — K222 Esophageal obstruction: Secondary | ICD-10-CM | POA: Diagnosis not present

## 2018-07-19 DIAGNOSIS — K317 Polyp of stomach and duodenum: Secondary | ICD-10-CM | POA: Diagnosis not present

## 2018-07-20 DIAGNOSIS — M4316 Spondylolisthesis, lumbar region: Secondary | ICD-10-CM | POA: Diagnosis not present

## 2018-07-28 DIAGNOSIS — M7661 Achilles tendinitis, right leg: Secondary | ICD-10-CM | POA: Diagnosis not present

## 2018-07-28 DIAGNOSIS — M21612 Bunion of left foot: Secondary | ICD-10-CM | POA: Diagnosis not present

## 2018-07-28 DIAGNOSIS — M21611 Bunion of right foot: Secondary | ICD-10-CM | POA: Diagnosis not present

## 2018-08-04 DIAGNOSIS — M940 Chondrocostal junction syndrome [Tietze]: Secondary | ICD-10-CM | POA: Diagnosis not present

## 2018-08-05 DIAGNOSIS — M47816 Spondylosis without myelopathy or radiculopathy, lumbar region: Secondary | ICD-10-CM | POA: Diagnosis not present

## 2018-08-18 DIAGNOSIS — M722 Plantar fascial fibromatosis: Secondary | ICD-10-CM | POA: Diagnosis not present

## 2018-08-18 DIAGNOSIS — M21611 Bunion of right foot: Secondary | ICD-10-CM | POA: Diagnosis not present

## 2018-08-18 DIAGNOSIS — M2020 Hallux rigidus, unspecified foot: Secondary | ICD-10-CM | POA: Diagnosis not present

## 2018-08-18 DIAGNOSIS — M7661 Achilles tendinitis, right leg: Secondary | ICD-10-CM | POA: Diagnosis not present

## 2018-08-23 DIAGNOSIS — R030 Elevated blood-pressure reading, without diagnosis of hypertension: Secondary | ICD-10-CM | POA: Diagnosis not present

## 2018-08-23 DIAGNOSIS — M47812 Spondylosis without myelopathy or radiculopathy, cervical region: Secondary | ICD-10-CM | POA: Diagnosis not present

## 2018-08-23 DIAGNOSIS — M4316 Spondylolisthesis, lumbar region: Secondary | ICD-10-CM | POA: Diagnosis not present

## 2018-08-23 DIAGNOSIS — Z6825 Body mass index (BMI) 25.0-25.9, adult: Secondary | ICD-10-CM | POA: Diagnosis not present

## 2018-10-11 ENCOUNTER — Encounter: Payer: Self-pay | Admitting: Podiatry

## 2018-10-11 ENCOUNTER — Ambulatory Visit (INDEPENDENT_AMBULATORY_CARE_PROVIDER_SITE_OTHER): Payer: Medicare Other

## 2018-10-11 ENCOUNTER — Ambulatory Visit (INDEPENDENT_AMBULATORY_CARE_PROVIDER_SITE_OTHER): Payer: Medicare Other | Admitting: Podiatry

## 2018-10-11 ENCOUNTER — Other Ambulatory Visit: Payer: Self-pay

## 2018-10-11 VITALS — BP 135/78 | HR 81 | Temp 97.7°F | Resp 16

## 2018-10-11 DIAGNOSIS — M205X1 Other deformities of toe(s) (acquired), right foot: Secondary | ICD-10-CM

## 2018-10-11 DIAGNOSIS — M7661 Achilles tendinitis, right leg: Secondary | ICD-10-CM | POA: Diagnosis not present

## 2018-10-11 MED ORDER — TRIAMCINOLONE ACETONIDE 10 MG/ML IJ SUSP
10.0000 mg | Freq: Once | INTRAMUSCULAR | Status: AC
  Administered 2018-10-11: 10:00:00 10 mg

## 2018-10-11 NOTE — Patient Instructions (Signed)

## 2018-10-11 NOTE — Progress Notes (Signed)
Subjective:   Patient ID: Mary Harrell, female   DOB: 68 y.o.   MRN: [id number]   HPI Patient presents with a lot of pain in the back of the right heel and states that it is been going on for around 4 months and she is also started developed hip pain she is walking differently.  Patient does not smoke and likes to be active   Review of Systems  All other systems reviewed and are negative.       Objective:  Physical Exam Vitals signs and nursing note reviewed.  Constitutional:      Appearance: She is well-developed.  Pulmonary:     Effort: Pulmonary effort is normal.  Musculoskeletal: Normal range of motion.  Skin:    General: Skin is warm.  Neurological:     Mental Status: She is alert.     Neurovascular status found to be intact muscle strength is adequate range of motion within normal limits.  Patient is found to have posterior pain in the heel right lateral side at the insertion tendon into the posterior calcaneus with no indications of equinus or indications of muscle strength loss.  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  I explained injection and risk and she wants procedure and today I went ahead did sterile prep and injected the posterior lateral aspect of the calcaneus keeping it away from the center medial side with 3 mg of dexamethasone Kenalog 5 mg Xylocaine advised on reduced activity and discussed possible immobilization or other treatment.  Reappoint 2 weeks to reevaluate  X-ray indicated third posterior spur formation small in nature but present with no indication to stress fracture arthritis

## 2018-10-25 ENCOUNTER — Ambulatory Visit (INDEPENDENT_AMBULATORY_CARE_PROVIDER_SITE_OTHER): Payer: Medicare Other | Admitting: Podiatry

## 2018-10-25 ENCOUNTER — Other Ambulatory Visit: Payer: Self-pay

## 2018-10-25 VITALS — Temp 97.5°F

## 2018-10-25 DIAGNOSIS — M205X1 Other deformities of toe(s) (acquired), right foot: Secondary | ICD-10-CM

## 2018-10-25 DIAGNOSIS — M7661 Achilles tendinitis, right leg: Secondary | ICD-10-CM

## 2018-10-27 NOTE — Progress Notes (Signed)
Subjective:   Patient ID: Mary Harrell, female   DOB: 68 y.o.   MRN: [id number]   HPI Patient continues to experience quite a bit of discomfort at the posterior insertion Achilles medial side central calcaneus.  Still has tight Achilles tendon and also has significant hallux limitus deformity right with lack of motion of the first MPJ   ROS      Objective:  Physical Exam  Achilles tendinitis has not responded to conservative injection treatment along with stretching and shoe gear modification along with hallux limitus rigidus that is mildly painful with significant range of motion loss     Assessment:  Reviewed the continued acute discomfort at the insertional point with Achilles tendinitis present and also hallux limitus     Plan:  H&P reviewed x-rays and today I recommended complete immobilization to allow resting of the tendon along with physical therapy with possibility for shockwave if symptoms persist.  Discussed possible correction at one point of the hallux limitus deformity but at this point we will defer and focus on the Achilles tendon.  Patient had air fracture walker dispensed with instructions on usage and will be seen back 3 weeks after completion of physical therapy

## 2018-10-29 ENCOUNTER — Encounter: Payer: Self-pay | Admitting: Podiatry

## 2018-10-29 DIAGNOSIS — M25572 Pain in left ankle and joints of left foot: Secondary | ICD-10-CM | POA: Diagnosis not present

## 2018-10-29 DIAGNOSIS — M62562 Muscle wasting and atrophy, not elsewhere classified, left lower leg: Secondary | ICD-10-CM | POA: Diagnosis not present

## 2018-10-29 DIAGNOSIS — R269 Unspecified abnormalities of gait and mobility: Secondary | ICD-10-CM | POA: Diagnosis not present

## 2018-10-29 DIAGNOSIS — M62572 Muscle wasting and atrophy, not elsewhere classified, left ankle and foot: Secondary | ICD-10-CM | POA: Diagnosis not present

## 2018-11-02 DIAGNOSIS — M62572 Muscle wasting and atrophy, not elsewhere classified, left ankle and foot: Secondary | ICD-10-CM | POA: Diagnosis not present

## 2018-11-02 DIAGNOSIS — R269 Unspecified abnormalities of gait and mobility: Secondary | ICD-10-CM | POA: Diagnosis not present

## 2018-11-02 DIAGNOSIS — M62562 Muscle wasting and atrophy, not elsewhere classified, left lower leg: Secondary | ICD-10-CM | POA: Diagnosis not present

## 2018-11-02 DIAGNOSIS — M25572 Pain in left ankle and joints of left foot: Secondary | ICD-10-CM | POA: Diagnosis not present

## 2018-11-04 DIAGNOSIS — M62562 Muscle wasting and atrophy, not elsewhere classified, left lower leg: Secondary | ICD-10-CM | POA: Diagnosis not present

## 2018-11-04 DIAGNOSIS — M62572 Muscle wasting and atrophy, not elsewhere classified, left ankle and foot: Secondary | ICD-10-CM | POA: Diagnosis not present

## 2018-11-04 DIAGNOSIS — M25572 Pain in left ankle and joints of left foot: Secondary | ICD-10-CM | POA: Diagnosis not present

## 2018-11-04 DIAGNOSIS — R269 Unspecified abnormalities of gait and mobility: Secondary | ICD-10-CM | POA: Diagnosis not present

## 2018-11-11 DIAGNOSIS — R269 Unspecified abnormalities of gait and mobility: Secondary | ICD-10-CM | POA: Diagnosis not present

## 2018-11-11 DIAGNOSIS — M62572 Muscle wasting and atrophy, not elsewhere classified, left ankle and foot: Secondary | ICD-10-CM | POA: Diagnosis not present

## 2018-11-11 DIAGNOSIS — M62562 Muscle wasting and atrophy, not elsewhere classified, left lower leg: Secondary | ICD-10-CM | POA: Diagnosis not present

## 2018-11-11 DIAGNOSIS — M25572 Pain in left ankle and joints of left foot: Secondary | ICD-10-CM | POA: Diagnosis not present

## 2018-11-15 DIAGNOSIS — M25572 Pain in left ankle and joints of left foot: Secondary | ICD-10-CM | POA: Diagnosis not present

## 2018-11-15 DIAGNOSIS — R269 Unspecified abnormalities of gait and mobility: Secondary | ICD-10-CM | POA: Diagnosis not present

## 2018-11-15 DIAGNOSIS — M2550 Pain in unspecified joint: Secondary | ICD-10-CM | POA: Diagnosis not present

## 2018-11-15 DIAGNOSIS — M62572 Muscle wasting and atrophy, not elsewhere classified, left ankle and foot: Secondary | ICD-10-CM | POA: Diagnosis not present

## 2018-11-15 DIAGNOSIS — M62562 Muscle wasting and atrophy, not elsewhere classified, left lower leg: Secondary | ICD-10-CM | POA: Diagnosis not present

## 2018-11-16 DIAGNOSIS — M2550 Pain in unspecified joint: Secondary | ICD-10-CM | POA: Diagnosis not present

## 2018-11-18 DIAGNOSIS — M62572 Muscle wasting and atrophy, not elsewhere classified, left ankle and foot: Secondary | ICD-10-CM | POA: Diagnosis not present

## 2018-11-18 DIAGNOSIS — M25572 Pain in left ankle and joints of left foot: Secondary | ICD-10-CM | POA: Diagnosis not present

## 2018-11-18 DIAGNOSIS — M62562 Muscle wasting and atrophy, not elsewhere classified, left lower leg: Secondary | ICD-10-CM | POA: Diagnosis not present

## 2018-11-18 DIAGNOSIS — R269 Unspecified abnormalities of gait and mobility: Secondary | ICD-10-CM | POA: Diagnosis not present

## 2018-11-22 ENCOUNTER — Telehealth: Payer: Self-pay | Admitting: *Deleted

## 2018-11-22 NOTE — Telephone Encounter (Signed)
Pt called states she has a complication.

## 2018-11-22 NOTE — Telephone Encounter (Signed)
I called pt and she states she has been working with Dr. Paulla Dolly with the achilles tendonitis and is having worsening pain in the right hip. Pt asked if Dr. Paulla Dolly could possibly refer and she would keep her appt with him on Monday.

## 2018-11-23 DIAGNOSIS — M62562 Muscle wasting and atrophy, not elsewhere classified, left lower leg: Secondary | ICD-10-CM | POA: Diagnosis not present

## 2018-11-23 DIAGNOSIS — R269 Unspecified abnormalities of gait and mobility: Secondary | ICD-10-CM | POA: Diagnosis not present

## 2018-11-23 DIAGNOSIS — M25572 Pain in left ankle and joints of left foot: Secondary | ICD-10-CM | POA: Diagnosis not present

## 2018-11-23 DIAGNOSIS — M62572 Muscle wasting and atrophy, not elsewhere classified, left ankle and foot: Secondary | ICD-10-CM | POA: Diagnosis not present

## 2018-11-23 NOTE — Telephone Encounter (Signed)
I would have her call Raliegh Ip

## 2018-11-23 NOTE — Telephone Encounter (Signed)
Pt called states she got an appt with Dr. Martie Round at [facility].

## 2018-11-23 NOTE — Telephone Encounter (Signed)
I called Mary Harrell and informed of Dr. Mellody Drown recommendation and Mary Harrell states she will call again if she needs a referral.

## 2018-11-25 ENCOUNTER — Other Ambulatory Visit: Payer: Self-pay | Admitting: Orthopedic Surgery

## 2018-11-25 DIAGNOSIS — M545 Low back pain, unspecified: Secondary | ICD-10-CM

## 2018-11-25 DIAGNOSIS — M25551 Pain in right hip: Secondary | ICD-10-CM | POA: Diagnosis not present

## 2018-11-26 DIAGNOSIS — R269 Unspecified abnormalities of gait and mobility: Secondary | ICD-10-CM | POA: Diagnosis not present

## 2018-11-26 DIAGNOSIS — M25572 Pain in left ankle and joints of left foot: Secondary | ICD-10-CM | POA: Diagnosis not present

## 2018-11-26 DIAGNOSIS — M62572 Muscle wasting and atrophy, not elsewhere classified, left ankle and foot: Secondary | ICD-10-CM | POA: Diagnosis not present

## 2018-11-26 DIAGNOSIS — M62562 Muscle wasting and atrophy, not elsewhere classified, left lower leg: Secondary | ICD-10-CM | POA: Diagnosis not present

## 2018-11-29 ENCOUNTER — Ambulatory Visit: Payer: Medicare Other | Admitting: Podiatry

## 2018-11-29 ENCOUNTER — Other Ambulatory Visit: Payer: Self-pay

## 2018-11-29 ENCOUNTER — Ambulatory Visit (INDEPENDENT_AMBULATORY_CARE_PROVIDER_SITE_OTHER): Payer: Medicare Other | Admitting: Podiatry

## 2018-11-29 ENCOUNTER — Encounter: Payer: Self-pay | Admitting: Podiatry

## 2018-11-29 VITALS — Temp 96.7°F

## 2018-11-29 DIAGNOSIS — M7661 Achilles tendinitis, right leg: Secondary | ICD-10-CM

## 2018-11-29 DIAGNOSIS — M205X1 Other deformities of toe(s) (acquired), right foot: Secondary | ICD-10-CM | POA: Diagnosis not present

## 2018-11-29 NOTE — Patient Instructions (Addendum)
Pre-Operative Instructions  Congratulations, you have decided to take an important step towards improving your quality of life.  You can be assured that the doctors and staff at [facility] will be with you every step of the way.  Here are some important things you should know:  1. Plan to be at the surgery center/hospital at least 1 (one) hour prior to your scheduled time, unless otherwise directed by the surgical center/hospital staff.  You must have a responsible adult accompany you, remain during the surgery and drive you home.  Make sure you have directions to the surgical center/hospital to ensure you arrive on time. 2. If you are having surgery at [facility] or [facility] hospitals, you will need a copy of your medical history and physical form from your family physician within one month prior to the date of surgery. We will give you a form for your primary physician to complete.  3. We make every effort to accommodate the date you request for surgery.  However, there are times where surgery dates or times have to be moved.  We will contact you as soon as possible if a change in schedule is required.   4. No aspirin/ibuprofen for one week before surgery.  If you are on aspirin, any non-steroidal anti-inflammatory medications (Mobic, Aleve, Ibuprofen) should not be taken seven (7) days prior to your surgery.  You make take Tylenol for pain prior to surgery.  5. Medications - If you are taking daily heart and blood pressure medications, seizure, reflux, allergy, asthma, anxiety, pain or diabetes medications, make sure you notify the surgery center/hospital before the day of surgery so they can tell you which medications you should take or avoid the day of surgery. 6. No food or drink after midnight the night before surgery unless directed otherwise by surgical center/hospital staff. 7. No alcoholic beverages 24-hours prior to surgery.  No smoking 24-hours prior or 24-hours after  surgery. 8. Wear loose pants or shorts. They should be loose enough to fit over bandages, boots, and casts. 9. Don't wear slip-on shoes. Sneakers are preferred. 10. Bring your boot with you to the surgery center/hospital.  Also bring crutches or a walker if your physician has prescribed it for you.  If you do not have this equipment, it will be provided for you after surgery. 11. If you have not been contacted by the surgery center/hospital by the day before your surgery, call to confirm the date and time of your surgery. 12. Leave-time from work may vary depending on the type of surgery you have.  Appropriate arrangements should be made prior to surgery with your employer. 13. Prescriptions will be provided immediately following surgery by your doctor.  Fill these as soon as possible after surgery and take the medication as directed. Pain medications will not be refilled on weekends and must be approved by the doctor. 14. Remove nail polish on the operative foot and avoid getting pedicures prior to surgery. 15. Wash the night before surgery.  The night before surgery wash the foot and leg well with water and the antibacterial soap provided. Be sure to pay special attention to beneath the toenails and in between the toes.  Wash for at least three (3) minutes. Rinse thoroughly with water and dry well with a towel.  Perform this wash unless told not to do so by your physician.  Enclosed: 1 Ice pack (please put in freezer the night before surgery)   1 Hibiclens skin cleaner     Pre-op instructions  If you have any questions regarding the instructions, please do not hesitate to call our office.  [place]: [address], [place], [place] [address] -- [phone]  [place]: [address] [place], [place] [address] -- [phone]  [place]: [address]  [place], [place] [address] -- [phone]  [place]: [address], [address], [place], [place] [address] -- [phone]  Website:  [email or web address] 

## 2018-11-30 NOTE — Progress Notes (Signed)
Subjective:   Patient ID: Mary Harrell, female   DOB: 68 y.o.   MRN: [id number]   HPI Patient presents stating that this has really been hurting me and I also having trouble my hip due to have an MRI.  Patient points to the lateral side of the posterior heel stating that inside in the center is not bothering her   ROS      Objective:  Physical Exam  Neurovascular status intact with patient found to have exquisite discomfort posterior lateral aspect right heel with a ridge of bone noted in this area and no symptoms in the center or medial side of the tendon attachment     Assessment:  Chronic Achilles tendinitis right with bone spur formation that aggravated     Plan:  H&P discussed all conservative treatment we have done without relief and that ultimately this will probably require surgery but I do think we can do a procedure where I just removed the lateral side of the spur along with repositioning the tendon and not having to disturb medial or central which should allow for much quicker recovery and allow her to be ambulatory.  I did allow her to read the consent form and spent a great deal of time with her going over all possible complications and the fact there is no guarantee that this would do well and the fact exist and may not solve her problem possibility that the skin will not heal properly and other conditions as outlined.  She is willing to accept this risk wants surgery and signed consent form after extensive review and is scheduled for outpatient surgery at her convenience and will call and is also encouraged to call me with any questions that she may have concerning this and the fact she does understand recovery will take 6 months to 1 year and also that she will be utilizing boot postoperatively signed visit

## 2018-12-01 ENCOUNTER — Telehealth: Payer: Self-pay | Admitting: *Deleted

## 2018-12-01 NOTE — Telephone Encounter (Signed)
"  I'm a patient of Dr. Paulla Dolly.  I'd like to discuss setting up surgery with you.  Please call me back."

## 2018-12-02 DIAGNOSIS — M150 Primary generalized (osteo)arthritis: Secondary | ICD-10-CM | POA: Diagnosis not present

## 2018-12-02 DIAGNOSIS — Z6825 Body mass index (BMI) 25.0-25.9, adult: Secondary | ICD-10-CM | POA: Diagnosis not present

## 2018-12-02 DIAGNOSIS — M2550 Pain in unspecified joint: Secondary | ICD-10-CM | POA: Diagnosis not present

## 2018-12-02 DIAGNOSIS — E663 Overweight: Secondary | ICD-10-CM | POA: Diagnosis not present

## 2018-12-02 NOTE — Telephone Encounter (Signed)
Is there a laser treatment he can do for the bone spur.  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  We didn't talk about it?"  Yes, he can do that procedure as well but you need to see him for another consultation to add the procedure on.  He does not do laser to treat bone spurs.  "So I will need to come in for another appointment.  How long will I not be able to get around?  I think he said four to six weeks."  You will not be able to get around for about six weeks.  "I have been trying to wear this boot.  I wore it for two days.  It started to hurt my hip and I was wearing a sneaker with it.  How am I supposed to handle this after the surgery?"  Dr. Paulla Dolly is requesting a knee scooter for you.  "Will I be able to drive with that boot on?  If not, how long will I be wearing it?"  You will not be able to drive.  You will probably be in the boot for a month.  "My husband and I have a trip planned for July but I'm not sure if we'll be able to go or not with this Covid.  Can I set a date for July 21 and August 11?"  No, you must choose a date, I can only schedule one.  "I'll just schedule it for August 11.  Can I call back to reschedule it if I'm not able to travel in July due to this Covid?"  Yes, you can call and reschedule it if needed.  "I'll call back tomorrow and schedule an appointment so I can get him to add this other problem.  Thank you for answering my questions.

## 2018-12-03 DIAGNOSIS — M545 Low back pain: Secondary | ICD-10-CM | POA: Diagnosis not present

## 2018-12-03 DIAGNOSIS — M25551 Pain in right hip: Secondary | ICD-10-CM | POA: Diagnosis not present

## 2018-12-06 ENCOUNTER — Telehealth: Payer: Self-pay

## 2018-12-06 NOTE — Telephone Encounter (Signed)
I left her a message that Dr. Paulla Dolly does not have her scheduled for the Bunion on January 18, 2019.

## 2018-12-07 DIAGNOSIS — M25551 Pain in right hip: Secondary | ICD-10-CM | POA: Diagnosis not present

## 2018-12-16 ENCOUNTER — Telehealth: Payer: Self-pay | Admitting: *Deleted

## 2018-12-16 DIAGNOSIS — M7661 Achilles tendinitis, right leg: Secondary | ICD-10-CM

## 2018-12-16 NOTE — Telephone Encounter (Signed)
I informed pt I would transfer her remaining 6 PT visit orders to [facility]. Faxed required referral form to [facility].

## 2018-12-16 NOTE — Telephone Encounter (Signed)
Pt states she performed around 7 PT visits with [facility] - in-office, and would like to have PT at the [facility] prior to surgery.

## 2018-12-17 ENCOUNTER — Other Ambulatory Visit: Payer: Medicare Other

## 2018-12-20 ENCOUNTER — Telehealth: Payer: Self-pay | Admitting: Podiatry

## 2018-12-20 NOTE — Telephone Encounter (Signed)
I'm scheduled for surgery on 11 August and the trip I was going on the week before has been canceled. I was wondering if I can get my surgery date moved up.

## 2018-12-20 NOTE — Telephone Encounter (Signed)
Pt called back about moving her surgery date up from Tuesday, 11 August. I rescheduled pt's surgery to Tuesday, 28 July and changed the date in [facility] [employer] also. Pt is aware she will get a call 24-48 hours before to let her know of arrival time.

## 2018-12-21 DIAGNOSIS — M7661 Achilles tendinitis, right leg: Secondary | ICD-10-CM | POA: Diagnosis not present

## 2018-12-21 DIAGNOSIS — M1611 Unilateral primary osteoarthritis, right hip: Secondary | ICD-10-CM | POA: Diagnosis not present

## 2018-12-21 DIAGNOSIS — M79671 Pain in right foot: Secondary | ICD-10-CM | POA: Diagnosis not present

## 2018-12-21 DIAGNOSIS — M4726 Other spondylosis with radiculopathy, lumbar region: Secondary | ICD-10-CM | POA: Diagnosis not present

## 2018-12-28 ENCOUNTER — Telehealth: Payer: Self-pay

## 2018-12-28 DIAGNOSIS — M7661 Achilles tendinitis, right leg: Secondary | ICD-10-CM

## 2018-12-28 NOTE — Telephone Encounter (Signed)
Pt. Called stating she is having sx next week. Pt. States she would like to know if it's okay for her to take fish oil and aleve before and after sx, also would like a prescription for knee scooter

## 2018-12-29 ENCOUNTER — Telehealth: Payer: Self-pay | Admitting: *Deleted

## 2018-12-29 NOTE — Telephone Encounter (Signed)
Returned patient's phone call at [phone] (Cell #) to discuss questions they had before they have surgery next week with Dr. Paulla Dolly.  DOS 01/04/19 Tenolysis Partial w/Anchor and Calcaneal RT  Patient wanted to know if they should stop taking Aleve and fish oil before surgery. Dr. Paulla Dolly said for patient to stop taking Aleve 5 days before surgery but they could still take their Fish Oil on the day of surgery.  Patient also wants to get a knee scooter for after surgery. I will send that request to [facility], our surgical coordinator.  Patient stated she understood.

## 2018-12-31 NOTE — Telephone Encounter (Signed)
I am returning your call.  I will place an order for your knee scooter.  "What does that mean?"  I will place an order with [facility].  You will receive a call from them.  Dr. Paulla Dolly also said for you to stop taking the Aleve 5 days before the surgery and that it's okay to take the Fish Oil.  "Yes, the nurse had already called me about that she told me to stop taking the Aleve three days before."  Dr. Paulla Dolly said five so don't take it starting today.  "Okay, I won't.  Do you think I will get the scooter before my surgery on Tuesday?"  You will most likely have it by Tuesday.  I sent a message to Jolee Ewing at [employer] requesting her assistance in getting the patient a knee scooter.

## 2019-01-04 ENCOUNTER — Encounter: Payer: Self-pay | Admitting: Podiatry

## 2019-01-04 DIAGNOSIS — M7732 Calcaneal spur, left foot: Secondary | ICD-10-CM | POA: Diagnosis not present

## 2019-01-04 DIAGNOSIS — K219 Gastro-esophageal reflux disease without esophagitis: Secondary | ICD-10-CM | POA: Diagnosis not present

## 2019-01-04 DIAGNOSIS — M7731 Calcaneal spur, right foot: Secondary | ICD-10-CM | POA: Diagnosis not present

## 2019-01-04 DIAGNOSIS — M7661 Achilles tendinitis, right leg: Secondary | ICD-10-CM | POA: Diagnosis not present

## 2019-01-04 DIAGNOSIS — M7662 Achilles tendinitis, left leg: Secondary | ICD-10-CM | POA: Diagnosis not present

## 2019-01-05 NOTE — Addendum Note (Signed)
Addended by: Cranford Mon R on: 01/05/2019 04:30 PM   Modules accepted: Orders

## 2019-01-06 ENCOUNTER — Telehealth: Payer: Self-pay | Admitting: *Deleted

## 2019-01-06 NOTE — Telephone Encounter (Signed)
Called and spoke with the patient and patient stated that she was elevating and icing and has the boot on when up and I stated to just be up about 15 minutes on the hour and patient she has the boot off when not up and the pain level was about a 3 or 4 and I stated to patient that she might want to get a knee scooter and patient agreed verus the crutches and also patient was wandering if she had the anchor and did he have to cut the tendon and I stated that I would ask Dr Paulla Dolly and per Dr Paulla Dolly did not have to put anchor in and did not have to take a lot off the tendon and I stated that I would fax over a RX to [facility] to get a knee scooter and to call the office if any concerns or questions at [phone]. Lattie Haw

## 2019-01-12 ENCOUNTER — Other Ambulatory Visit: Payer: Self-pay

## 2019-01-12 ENCOUNTER — Encounter: Payer: Self-pay | Admitting: Podiatry

## 2019-01-12 ENCOUNTER — Ambulatory Visit (INDEPENDENT_AMBULATORY_CARE_PROVIDER_SITE_OTHER): Payer: Medicare Other | Admitting: Podiatry

## 2019-01-12 ENCOUNTER — Ambulatory Visit (INDEPENDENT_AMBULATORY_CARE_PROVIDER_SITE_OTHER): Payer: Medicare Other

## 2019-01-12 VITALS — Temp 97.9°F

## 2019-01-12 DIAGNOSIS — M7661 Achilles tendinitis, right leg: Secondary | ICD-10-CM

## 2019-01-12 DIAGNOSIS — M205X1 Other deformities of toe(s) (acquired), right foot: Secondary | ICD-10-CM

## 2019-01-12 DIAGNOSIS — Z09 Encounter for follow-up examination after completed treatment for conditions other than malignant neoplasm: Secondary | ICD-10-CM

## 2019-01-12 NOTE — Progress Notes (Signed)
Subjective:   Patient ID: Mary Harrell, female   DOB: 68 y.o.   MRN: [id number]   HPI Patient states had some pain for a few days but it is much better now with minimal discomfort and satisfied so far with how things are going   ROS      Objective:  Physical Exam  Neurovascular status intact negative Homans sign noted with incision intact right lateral posterior heel with stitches in place     Assessment:  Doing well post surgery of the right calcaneus with stitches in place     Plan:  X-ray reviewed sterile dressing reapplied continue elevation compression immobilization and reappoint 2 weeks suture removal or earlier if needed  X-rays indicate that there has been some resection of bone there is still some bone spur formation but that is more on the medial and central portion of the bone which is not currently symptomatic signed visit

## 2019-01-26 ENCOUNTER — Encounter: Payer: Self-pay | Admitting: Podiatry

## 2019-01-26 ENCOUNTER — Other Ambulatory Visit: Payer: Self-pay

## 2019-01-26 ENCOUNTER — Ambulatory Visit (INDEPENDENT_AMBULATORY_CARE_PROVIDER_SITE_OTHER): Payer: Medicare Other | Admitting: Podiatry

## 2019-01-26 ENCOUNTER — Ambulatory Visit (INDEPENDENT_AMBULATORY_CARE_PROVIDER_SITE_OTHER): Payer: Medicare Other

## 2019-01-26 DIAGNOSIS — M205X1 Other deformities of toe(s) (acquired), right foot: Secondary | ICD-10-CM

## 2019-01-26 DIAGNOSIS — M7661 Achilles tendinitis, right leg: Secondary | ICD-10-CM | POA: Diagnosis not present

## 2019-01-26 DIAGNOSIS — Z09 Encounter for follow-up examination after completed treatment for conditions other than malignant neoplasm: Secondary | ICD-10-CM

## 2019-01-26 NOTE — Progress Notes (Signed)
Subjective:   Patient ID: Mary Harrell, female   DOB: 68 y.o.   MRN: [id number]   HPI Patient presents stating that she seems to be doing very well and feels like she is starting to get irritation from the boot   ROS      Objective:  Physical Exam  Neurovascular status intact negative Homans sign was noted with patient's right posterior heel healing well with good alignment wound edges intact no breakdown of tissue and slight irritation of the central portion of the calcaneus posterior     Assessment:  Doing well overall with incision site healed well in good alignment with no current discomfort     Plan:  H&P and allow gradual weightbearing of the area and discussed continued immobilization but slowly wearing surgical shoe and dispensed ankle compression stocking along with surgical shoe.  Patient be seen back 4 weeks   X-ray indicates satisfactory lateral resection of bone with no indication of ostial lysis

## 2019-01-28 ENCOUNTER — Telehealth: Payer: Self-pay | Admitting: *Deleted

## 2019-01-28 DIAGNOSIS — R350 Frequency of micturition: Secondary | ICD-10-CM | POA: Diagnosis not present

## 2019-01-28 DIAGNOSIS — R3915 Urgency of urination: Secondary | ICD-10-CM | POA: Diagnosis not present

## 2019-01-28 NOTE — Telephone Encounter (Signed)
I called pt and informed that the padding at the back of the surgical shoe was a good idea, and if she had less than a few feet to go to the bathroom then it would be fine to go barefoot to the bathroom. I asked pt if she was sleeping in the compression sock and she stated that she was. I told her to take the compression sock off at bedtime, wash and rinse well, and hang to air dry, and put on 1st thing before swinging legs over the side of the bed. Pt thanked me for the call.

## 2019-01-28 NOTE — Telephone Encounter (Signed)
Pt states at last appt with Dr. Paulla Dolly he put her in a surgery shoe and she wanted to know if it was okay to put the pads from the boot in the back of the shoe to cushion, and is it okay for her to walk to the bathroom at night without the shoe, and she is wearing the compression sock.

## 2019-01-31 ENCOUNTER — Telehealth: Payer: Self-pay | Admitting: *Deleted

## 2019-01-31 NOTE — Telephone Encounter (Signed)
Emailed to pt the orders for the knee scooter.

## 2019-01-31 NOTE — Telephone Encounter (Signed)
I called pt and asked for the email. Pt states email to [email or web address].

## 2019-01-31 NOTE — Telephone Encounter (Signed)
Pt states she used a knee scooter after her 01/04/2019 surgery and would like to send for reimbursement herself, and needs the orders for the knee scooter.

## 2019-02-01 NOTE — Telephone Encounter (Signed)
Pt called states she has 3 questions concerning filing for reimbursement for the knee scooter with Medicare.

## 2019-02-01 NOTE — Telephone Encounter (Signed)
I called pt and she printed off the Medicare form and it asked for the reason she is submitting the form and she thinks the option is - because the provider is unable to, due to he is not the one that sold her the knee scooter, and she will use the NPI on the order from our office for Dr. Paulla Dolly, and the supplier is [employer]. I told her that sounded correct.

## 2019-02-23 ENCOUNTER — Other Ambulatory Visit: Payer: Self-pay

## 2019-02-23 ENCOUNTER — Ambulatory Visit (INDEPENDENT_AMBULATORY_CARE_PROVIDER_SITE_OTHER): Payer: Medicare Other

## 2019-02-23 ENCOUNTER — Ambulatory Visit (INDEPENDENT_AMBULATORY_CARE_PROVIDER_SITE_OTHER): Payer: Self-pay | Admitting: Podiatry

## 2019-02-23 ENCOUNTER — Encounter: Payer: Self-pay | Admitting: Podiatry

## 2019-02-23 DIAGNOSIS — M7661 Achilles tendinitis, right leg: Secondary | ICD-10-CM | POA: Diagnosis not present

## 2019-02-23 NOTE — Progress Notes (Signed)
Subjective:   Patient ID: Mary Harrell, female   DOB: 68 y.o.   MRN: [id number]   HPI Patient states I think overall I am doing pretty well and admits to go back to physical therapy as the area surgery is doing well but I am getting more pain in my calf muscle   ROS      Objective:  Physical Exam  Neurovascular status intact negative Homans sign noted right foot healing well wound edges well coapted minimal discomfort with moderate discomfort in the calf     Assessment:  Overall improved with mild discomfort calf muscle     Plan:  Dispensed a compression stocking with silicone advised on stretching exercises and will refer for physical therapy for the next several weeks  X-rays indicate good section of the posterior removed with no pathology noted

## 2019-02-25 DIAGNOSIS — H5212 Myopia, left eye: Secondary | ICD-10-CM | POA: Diagnosis not present

## 2019-02-25 DIAGNOSIS — H02831 Dermatochalasis of right upper eyelid: Secondary | ICD-10-CM | POA: Diagnosis not present

## 2019-02-25 DIAGNOSIS — H04123 Dry eye syndrome of bilateral lacrimal glands: Secondary | ICD-10-CM | POA: Diagnosis not present

## 2019-02-25 DIAGNOSIS — H5211 Myopia, right eye: Secondary | ICD-10-CM | POA: Diagnosis not present

## 2019-02-25 DIAGNOSIS — H43813 Vitreous degeneration, bilateral: Secondary | ICD-10-CM | POA: Diagnosis not present

## 2019-02-25 DIAGNOSIS — H02834 Dermatochalasis of left upper eyelid: Secondary | ICD-10-CM | POA: Diagnosis not present

## 2019-03-08 DIAGNOSIS — M79671 Pain in right foot: Secondary | ICD-10-CM | POA: Diagnosis not present

## 2019-03-08 DIAGNOSIS — M1611 Unilateral primary osteoarthritis, right hip: Secondary | ICD-10-CM | POA: Diagnosis not present

## 2019-03-08 DIAGNOSIS — M4726 Other spondylosis with radiculopathy, lumbar region: Secondary | ICD-10-CM | POA: Diagnosis not present

## 2019-03-08 DIAGNOSIS — Z23 Encounter for immunization: Secondary | ICD-10-CM | POA: Diagnosis not present

## 2019-03-08 DIAGNOSIS — M7661 Achilles tendinitis, right leg: Secondary | ICD-10-CM | POA: Diagnosis not present

## 2019-03-17 DIAGNOSIS — M79671 Pain in right foot: Secondary | ICD-10-CM | POA: Diagnosis not present

## 2019-03-17 DIAGNOSIS — M4726 Other spondylosis with radiculopathy, lumbar region: Secondary | ICD-10-CM | POA: Diagnosis not present

## 2019-03-17 DIAGNOSIS — M1611 Unilateral primary osteoarthritis, right hip: Secondary | ICD-10-CM | POA: Diagnosis not present

## 2019-03-17 DIAGNOSIS — M7661 Achilles tendinitis, right leg: Secondary | ICD-10-CM | POA: Diagnosis not present

## 2019-03-18 DIAGNOSIS — K921 Melena: Secondary | ICD-10-CM | POA: Diagnosis not present

## 2019-03-18 DIAGNOSIS — A059 Bacterial foodborne intoxication, unspecified: Secondary | ICD-10-CM | POA: Diagnosis not present

## 2019-03-18 DIAGNOSIS — K922 Gastrointestinal hemorrhage, unspecified: Secondary | ICD-10-CM | POA: Diagnosis not present

## 2019-03-25 DIAGNOSIS — A059 Bacterial foodborne intoxication, unspecified: Secondary | ICD-10-CM | POA: Diagnosis not present

## 2019-03-25 DIAGNOSIS — K625 Hemorrhage of anus and rectum: Secondary | ICD-10-CM | POA: Diagnosis not present

## 2019-04-02 DIAGNOSIS — R300 Dysuria: Secondary | ICD-10-CM | POA: Diagnosis not present

## 2019-04-02 DIAGNOSIS — N3001 Acute cystitis with hematuria: Secondary | ICD-10-CM | POA: Diagnosis not present

## 2019-04-06 ENCOUNTER — Ambulatory Visit (INDEPENDENT_AMBULATORY_CARE_PROVIDER_SITE_OTHER): Payer: Medicare Other | Admitting: Podiatry

## 2019-04-06 ENCOUNTER — Ambulatory Visit (INDEPENDENT_AMBULATORY_CARE_PROVIDER_SITE_OTHER): Payer: Medicare Other

## 2019-04-06 ENCOUNTER — Other Ambulatory Visit: Payer: Self-pay

## 2019-04-06 ENCOUNTER — Encounter: Payer: Self-pay | Admitting: Podiatry

## 2019-04-06 DIAGNOSIS — M7661 Achilles tendinitis, right leg: Secondary | ICD-10-CM | POA: Diagnosis not present

## 2019-04-06 DIAGNOSIS — M205X1 Other deformities of toe(s) (acquired), right foot: Secondary | ICD-10-CM

## 2019-04-06 DIAGNOSIS — Z09 Encounter for follow-up examination after completed treatment for conditions other than malignant neoplasm: Secondary | ICD-10-CM

## 2019-04-08 NOTE — Progress Notes (Signed)
Subjective:   Patient ID: Mary Harrell, female   DOB: 68 y.o.   MRN: [id number]   HPI Patient states improving and states that there is still some soreness but overall getting better   ROS      Objective:  Physical Exam  Neurovascular status intact with patient's posterior lateral pain improving right with mild swelling across the heel but overall much better with good strength of the Achilles noted     Assessment:  Appears to be improving with a modified type Achilles tendon procedure right     Plan:  H&P reviewed x-ray and at this point continue range of motion exercises increased activities and padding for the back of the heel.  Patient will be seen back to recheck in 8 weeks or earlier if needed  X-rays indicate there is some posterior spurring right is we are not able to remove the spurs we did not do complete detachment of the tendon.  I do not think this will be symptomatic as the pain was all lateral and seems to be improving

## 2019-04-14 DIAGNOSIS — M47812 Spondylosis without myelopathy or radiculopathy, cervical region: Secondary | ICD-10-CM | POA: Diagnosis not present

## 2019-04-14 DIAGNOSIS — M47816 Spondylosis without myelopathy or radiculopathy, lumbar region: Secondary | ICD-10-CM | POA: Diagnosis not present

## 2019-04-15 DIAGNOSIS — Z0000 Encounter for general adult medical examination without abnormal findings: Secondary | ICD-10-CM | POA: Diagnosis not present

## 2019-04-15 DIAGNOSIS — M859 Disorder of bone density and structure, unspecified: Secondary | ICD-10-CM | POA: Diagnosis not present

## 2019-04-15 DIAGNOSIS — Z79899 Other long term (current) drug therapy: Secondary | ICD-10-CM | POA: Diagnosis not present

## 2019-04-15 DIAGNOSIS — E7800 Pure hypercholesterolemia, unspecified: Secondary | ICD-10-CM | POA: Diagnosis not present

## 2019-04-18 DIAGNOSIS — E7800 Pure hypercholesterolemia, unspecified: Secondary | ICD-10-CM | POA: Diagnosis not present

## 2019-04-18 DIAGNOSIS — Z0000 Encounter for general adult medical examination without abnormal findings: Secondary | ICD-10-CM | POA: Diagnosis not present

## 2019-04-18 DIAGNOSIS — M5090 Cervical disc disorder, unspecified, unspecified cervical region: Secondary | ICD-10-CM | POA: Diagnosis not present

## 2019-04-18 DIAGNOSIS — G43109 Migraine with aura, not intractable, without status migrainosus: Secondary | ICD-10-CM | POA: Diagnosis not present

## 2019-04-18 DIAGNOSIS — Z6825 Body mass index (BMI) 25.0-25.9, adult: Secondary | ICD-10-CM | POA: Diagnosis not present

## 2019-04-18 DIAGNOSIS — M8580 Other specified disorders of bone density and structure, unspecified site: Secondary | ICD-10-CM | POA: Diagnosis not present

## 2019-04-18 DIAGNOSIS — M5106 Intervertebral disc disorders with myelopathy, lumbar region: Secondary | ICD-10-CM | POA: Diagnosis not present

## 2019-05-16 DIAGNOSIS — L821 Other seborrheic keratosis: Secondary | ICD-10-CM | POA: Diagnosis not present

## 2019-05-16 DIAGNOSIS — D225 Melanocytic nevi of trunk: Secondary | ICD-10-CM | POA: Diagnosis not present

## 2019-05-16 DIAGNOSIS — L814 Other melanin hyperpigmentation: Secondary | ICD-10-CM | POA: Diagnosis not present

## 2019-05-16 DIAGNOSIS — L820 Inflamed seborrheic keratosis: Secondary | ICD-10-CM | POA: Diagnosis not present

## 2019-05-16 DIAGNOSIS — D1801 Hemangioma of skin and subcutaneous tissue: Secondary | ICD-10-CM | POA: Diagnosis not present

## 2019-05-16 DIAGNOSIS — Z85828 Personal history of other malignant neoplasm of skin: Secondary | ICD-10-CM | POA: Diagnosis not present

## 2019-06-30 ENCOUNTER — Ambulatory Visit: Payer: Medicare Other | Attending: Internal Medicine

## 2019-06-30 DIAGNOSIS — Z23 Encounter for immunization: Secondary | ICD-10-CM

## 2019-06-30 NOTE — Progress Notes (Signed)
   Covid-19 Vaccination Clinic  Name:  Mary Harrell    MRN: [id number] DOB: [birth year]  06/30/2019   [name] was observed post Covid-19 immunization for 15 minutes without incidence. She was provided with Vaccine Information Sheet and instruction to access the V-Safe system.   [name] was instructed to call 911 with any severe reactions post vaccine: Marland Kitchen Difficulty breathing  . Swelling of your face and throat  . A fast heartbeat  . A bad rash all over your body  . Dizziness and weakness    Immunizations Administered    Name Date Dose VIS Date Route   Pfizer COVID-19 Vaccine 06/30/2019  5:45 PM 0.3 mL 05/20/2019 Intramuscular   Manufacturer: [place]   Lot: [id number]   [place]: [id number]

## 2019-07-07 DIAGNOSIS — Z1231 Encounter for screening mammogram for malignant neoplasm of breast: Secondary | ICD-10-CM | POA: Diagnosis not present

## 2019-07-21 ENCOUNTER — Ambulatory Visit: Payer: Medicare Other | Attending: Internal Medicine

## 2019-07-21 DIAGNOSIS — Z23 Encounter for immunization: Secondary | ICD-10-CM | POA: Insufficient documentation

## 2019-07-21 NOTE — Progress Notes (Signed)
   Covid-19 Vaccination Clinic  Name:  Mary Harrell    MRN: [id number] DOB: 28-Mar-1951  07/21/2019  Ms. Mary Harrell was observed post Covid-19 immunization for 15 minutes without incidence. She was provided with Vaccine Information Sheet and instruction to access the V-Safe system.   Ms. Mary Harrell was instructed to call 911 with any severe reactions post vaccine: Marland Kitchen Difficulty breathing  . Swelling of your face and throat  . A fast heartbeat  . A bad rash all over your body  . Dizziness and weakness    Immunizations Administered    Name Date Dose VIS Date Route   Pfizer COVID-19 Vaccine 07/21/2019  8:34 AM 0.3 mL 05/20/2019 Intramuscular   Manufacturer: [place]   Lot: [id number]   [place]: [id number]

## 2019-08-04 ENCOUNTER — Ambulatory Visit: Payer: Medicare Other

## 2019-08-04 DIAGNOSIS — M47816 Spondylosis without myelopathy or radiculopathy, lumbar region: Secondary | ICD-10-CM | POA: Diagnosis not present

## 2019-08-10 ENCOUNTER — Ambulatory Visit (INDEPENDENT_AMBULATORY_CARE_PROVIDER_SITE_OTHER): Payer: Medicare Other | Admitting: Podiatry

## 2019-08-10 ENCOUNTER — Other Ambulatory Visit: Payer: Self-pay

## 2019-08-10 ENCOUNTER — Encounter: Payer: Self-pay | Admitting: Podiatry

## 2019-08-10 ENCOUNTER — Ambulatory Visit (INDEPENDENT_AMBULATORY_CARE_PROVIDER_SITE_OTHER): Payer: Medicare Other

## 2019-08-10 VITALS — Temp 97.7°F

## 2019-08-10 DIAGNOSIS — M7661 Achilles tendinitis, right leg: Secondary | ICD-10-CM

## 2019-08-10 DIAGNOSIS — Z09 Encounter for follow-up examination after completed treatment for conditions other than malignant neoplasm: Secondary | ICD-10-CM | POA: Diagnosis not present

## 2019-08-10 DIAGNOSIS — M205X1 Other deformities of toe(s) (acquired), right foot: Secondary | ICD-10-CM | POA: Diagnosis not present

## 2019-08-10 NOTE — Progress Notes (Signed)
Subjective:   Patient ID: Mary Harrell, female   DOB: 69 y.o.   MRN: [id number]   HPI Patient states just recently I tried different shoes and I irritated the back of my heel and it seems to be in a different area but it is sore when I try to walk or wear shoes   ROS      Objective:  Physical Exam  Neurovascular status intact with well-healed surgical site lateral aspect of the posterior heel right that upon palpation does not show pain with pain more on the medial side and slightly more proximal but not at the muscle tendon junction     Assessment:  Achilles tendinitis right more medial side with no center or lateral involvement     Plan:  H&P reviewed condition and since it is an acute process I did discuss steroid injection explaining risk associated with this and she is willing to accept risk.  I did sterile prep and injected carefully 3 mg dexamethasone 5 mg Xylocaine advised on reduced activity and boot usage and reappoint again 3 weeks or earlier if needed  X-rays do indicate some spurring of the posterior heel with no other pathology noted currently

## 2019-08-11 ENCOUNTER — Telehealth: Payer: Self-pay | Admitting: *Deleted

## 2019-08-11 DIAGNOSIS — M205X1 Other deformities of toe(s) (acquired), right foot: Secondary | ICD-10-CM

## 2019-08-11 DIAGNOSIS — Z09 Encounter for follow-up examination after completed treatment for conditions other than malignant neoplasm: Secondary | ICD-10-CM

## 2019-08-11 DIAGNOSIS — M7661 Achilles tendinitis, right leg: Secondary | ICD-10-CM

## 2019-08-11 NOTE — Telephone Encounter (Signed)
Pt states she saw Dr. Paulla Dolly yesterday and he was to set up PT with [facility], but the orders have not been received.

## 2019-08-16 DIAGNOSIS — M4726 Other spondylosis with radiculopathy, lumbar region: Secondary | ICD-10-CM | POA: Diagnosis not present

## 2019-08-16 DIAGNOSIS — M79671 Pain in right foot: Secondary | ICD-10-CM | POA: Diagnosis not present

## 2019-08-16 DIAGNOSIS — M7661 Achilles tendinitis, right leg: Secondary | ICD-10-CM | POA: Diagnosis not present

## 2019-08-16 DIAGNOSIS — M1611 Unilateral primary osteoarthritis, right hip: Secondary | ICD-10-CM | POA: Diagnosis not present

## 2019-08-17 NOTE — Telephone Encounter (Signed)
Faxed referral, and demographics to [facility].

## 2019-08-17 NOTE — Telephone Encounter (Signed)
I'm not sure if we send orders over to [facility] pt. Up to pt what they want to do and then we can sign

## 2019-08-17 NOTE — Telephone Encounter (Signed)
Left message informing pt of Dr.Regal's orders.

## 2019-08-24 DIAGNOSIS — M7661 Achilles tendinitis, right leg: Secondary | ICD-10-CM | POA: Diagnosis not present

## 2019-08-24 DIAGNOSIS — M79671 Pain in right foot: Secondary | ICD-10-CM | POA: Diagnosis not present

## 2019-08-24 DIAGNOSIS — M1611 Unilateral primary osteoarthritis, right hip: Secondary | ICD-10-CM | POA: Diagnosis not present

## 2019-08-24 DIAGNOSIS — M4726 Other spondylosis with radiculopathy, lumbar region: Secondary | ICD-10-CM | POA: Diagnosis not present

## 2019-09-05 DIAGNOSIS — H5789 Other specified disorders of eye and adnexa: Secondary | ICD-10-CM | POA: Diagnosis not present

## 2019-09-05 DIAGNOSIS — H60501 Unspecified acute noninfective otitis externa, right ear: Secondary | ICD-10-CM | POA: Diagnosis not present

## 2019-09-07 ENCOUNTER — Encounter: Payer: Self-pay | Admitting: Podiatry

## 2019-09-07 ENCOUNTER — Ambulatory Visit (INDEPENDENT_AMBULATORY_CARE_PROVIDER_SITE_OTHER): Payer: Medicare Other | Admitting: Podiatry

## 2019-09-07 ENCOUNTER — Other Ambulatory Visit: Payer: Self-pay

## 2019-09-07 VITALS — Temp 96.9°F

## 2019-09-07 DIAGNOSIS — M7661 Achilles tendinitis, right leg: Secondary | ICD-10-CM | POA: Diagnosis not present

## 2019-09-07 DIAGNOSIS — M7671 Peroneal tendinitis, right leg: Secondary | ICD-10-CM | POA: Diagnosis not present

## 2019-09-07 NOTE — Progress Notes (Signed)
Subjective:   Patient ID: Mary Harrell, female   DOB: 69 y.o.   MRN: [id number]   HPI Patient presents stating that the inside of my Achilles seems better the Achilles itself seems pretty well but it seems to hurt more into my ankle right   ROS      Objective:  Physical Exam  Neurovascular status intact with the posterior ankle actually doing pretty well right I was able to palpate without significant discomfort.  There is now pain in the peroneal tendon underneath the lateral malleolus and this seems to be inflamed but no indications of muscle strength loss      Assessment:  Achilles tendinitis right which appears to be improved with peroneal tendinitis right     Plan:  H&P reviewed the peroneal tendinitis and did sterile prep and injected the peroneal 3 mg Dexasone Kenalog 5 mg Xylocaine and for the Achilles I discussed stretching exercises anti-inflammatory therapy and heel lift.  Patient is discharged will be seen back as needed

## 2019-09-08 ENCOUNTER — Telehealth: Payer: Self-pay | Admitting: Podiatry

## 2019-09-08 NOTE — Telephone Encounter (Signed)
I got your message about my medical records being ready. I will not be able to stop in this afternoon to get them and I wasn't sure if you were open tomorrow. If not, I can stop by on Monday to get my records.

## 2019-09-08 NOTE — Telephone Encounter (Signed)
Left voicemail letting pt know that her medical records are ready to pick up at her convenience. Told her I was leaving them at the front check in/check out desk. Stated she could call me with any questions.

## 2019-09-08 NOTE — Telephone Encounter (Signed)
Called pt to let her know that we are closed tomorrow for Easter but we would be open back up Monday at 8 am.

## 2019-10-05 ENCOUNTER — Other Ambulatory Visit: Payer: Self-pay

## 2019-10-05 ENCOUNTER — Encounter: Payer: Self-pay | Admitting: Podiatry

## 2019-10-05 ENCOUNTER — Ambulatory Visit (INDEPENDENT_AMBULATORY_CARE_PROVIDER_SITE_OTHER): Payer: Medicare Other | Admitting: Podiatry

## 2019-10-05 VITALS — Temp 97.2°F

## 2019-10-05 DIAGNOSIS — M7671 Peroneal tendinitis, right leg: Secondary | ICD-10-CM

## 2019-10-05 DIAGNOSIS — M205X1 Other deformities of toe(s) (acquired), right foot: Secondary | ICD-10-CM

## 2019-10-05 DIAGNOSIS — M7661 Achilles tendinitis, right leg: Secondary | ICD-10-CM

## 2019-10-05 NOTE — Progress Notes (Signed)
Subjective:   Patient ID: Mary Harrell, female   DOB: 69 y.o.   MRN: [id number]   HPI Patient presents stating she is concerned about the tendon on the outside of her leg she is concerned about her back of her calf still hurting in her big toe joint not bending.  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  Patient will be seen back to recheck we spent a great deal time today reviewing these different options

## 2019-10-20 DIAGNOSIS — M7661 Achilles tendinitis, right leg: Secondary | ICD-10-CM | POA: Diagnosis not present

## 2019-11-24 DIAGNOSIS — M47816 Spondylosis without myelopathy or radiculopathy, lumbar region: Secondary | ICD-10-CM | POA: Diagnosis not present

## 2019-12-27 DIAGNOSIS — M47816 Spondylosis without myelopathy or radiculopathy, lumbar region: Secondary | ICD-10-CM | POA: Diagnosis not present

## 2020-01-19 DIAGNOSIS — M2021 Hallux rigidus, right foot: Secondary | ICD-10-CM | POA: Diagnosis not present

## 2020-01-19 DIAGNOSIS — M6788 Other specified disorders of synovium and tendon, other site: Secondary | ICD-10-CM | POA: Diagnosis not present

## 2020-01-19 DIAGNOSIS — M9261 Juvenile osteochondrosis of tarsus, right ankle: Secondary | ICD-10-CM | POA: Diagnosis not present

## 2020-01-19 DIAGNOSIS — Z6824 Body mass index (BMI) 24.0-24.9, adult: Secondary | ICD-10-CM | POA: Diagnosis not present

## 2020-02-09 DIAGNOSIS — M47816 Spondylosis without myelopathy or radiculopathy, lumbar region: Secondary | ICD-10-CM | POA: Diagnosis not present

## 2020-02-09 DIAGNOSIS — H44002 Unspecified purulent endophthalmitis, left eye: Secondary | ICD-10-CM | POA: Diagnosis not present

## 2020-02-14 ENCOUNTER — Other Ambulatory Visit: Payer: Self-pay | Admitting: Critical Care Medicine

## 2020-02-14 ENCOUNTER — Other Ambulatory Visit: Payer: Medicare Other

## 2020-02-14 DIAGNOSIS — M4316 Spondylolisthesis, lumbar region: Secondary | ICD-10-CM | POA: Diagnosis not present

## 2020-02-14 DIAGNOSIS — M47816 Spondylosis without myelopathy or radiculopathy, lumbar region: Secondary | ICD-10-CM | POA: Diagnosis not present

## 2020-02-14 DIAGNOSIS — Z20822 Contact with and (suspected) exposure to covid-19: Secondary | ICD-10-CM

## 2020-02-15 LAB — SARS-COV-2, NAA 2 DAY TAT

## 2020-02-15 LAB — NOVEL CORONAVIRUS, NAA: SARS-CoV-2, NAA: NOT DETECTED

## 2020-02-24 DIAGNOSIS — M79661 Pain in right lower leg: Secondary | ICD-10-CM | POA: Diagnosis not present

## 2020-02-24 DIAGNOSIS — R634 Abnormal weight loss: Secondary | ICD-10-CM | POA: Diagnosis not present

## 2020-02-24 DIAGNOSIS — R252 Cramp and spasm: Secondary | ICD-10-CM | POA: Diagnosis not present

## 2020-02-24 DIAGNOSIS — M79662 Pain in left lower leg: Secondary | ICD-10-CM | POA: Diagnosis not present

## 2020-02-24 DIAGNOSIS — Z6825 Body mass index (BMI) 25.0-25.9, adult: Secondary | ICD-10-CM | POA: Diagnosis not present

## 2020-02-24 DIAGNOSIS — Z23 Encounter for immunization: Secondary | ICD-10-CM | POA: Diagnosis not present

## 2020-02-28 DIAGNOSIS — H04123 Dry eye syndrome of bilateral lacrimal glands: Secondary | ICD-10-CM | POA: Diagnosis not present

## 2020-02-28 DIAGNOSIS — H52222 Regular astigmatism, left eye: Secondary | ICD-10-CM | POA: Diagnosis not present

## 2020-02-28 DIAGNOSIS — H02834 Dermatochalasis of left upper eyelid: Secondary | ICD-10-CM | POA: Diagnosis not present

## 2020-02-28 DIAGNOSIS — H5212 Myopia, left eye: Secondary | ICD-10-CM | POA: Diagnosis not present

## 2020-02-28 DIAGNOSIS — H25813 Combined forms of age-related cataract, bilateral: Secondary | ICD-10-CM | POA: Diagnosis not present

## 2020-02-28 DIAGNOSIS — H02831 Dermatochalasis of right upper eyelid: Secondary | ICD-10-CM | POA: Diagnosis not present

## 2020-02-28 DIAGNOSIS — H524 Presbyopia: Secondary | ICD-10-CM | POA: Diagnosis not present

## 2020-02-28 DIAGNOSIS — H5211 Myopia, right eye: Secondary | ICD-10-CM | POA: Diagnosis not present

## 2020-03-14 ENCOUNTER — Other Ambulatory Visit (HOSPITAL_COMMUNITY): Payer: Self-pay | Admitting: Internal Medicine

## 2020-03-14 ENCOUNTER — Ambulatory Visit: Payer: Medicare Other | Attending: Internal Medicine

## 2020-03-14 DIAGNOSIS — Z23 Encounter for immunization: Secondary | ICD-10-CM

## 2020-03-14 NOTE — Progress Notes (Signed)
   Covid-19 Vaccination Clinic  Name:  SUMAN TRIVEDI    MRN: [id number] DOB: December 22, 1950  03/14/2020  Ms. Rawlinson was observed post Covid-19 immunization for 15 minutes without incident. She was provided with Vaccine Information Sheet and instruction to access the V-Safe system.   Ms. Gladman was instructed to call 911 with any severe reactions post vaccine: Marland Kitchen Difficulty breathing  . Swelling of face and throat  . A fast heartbeat  . A bad rash all over body  . Dizziness and weakness

## 2020-03-21 DIAGNOSIS — Z01812 Encounter for preprocedural laboratory examination: Secondary | ICD-10-CM | POA: Diagnosis not present

## 2020-03-23 DIAGNOSIS — Z79899 Other long term (current) drug therapy: Secondary | ICD-10-CM | POA: Diagnosis not present

## 2020-03-23 DIAGNOSIS — M2021 Hallux rigidus, right foot: Secondary | ICD-10-CM | POA: Diagnosis not present

## 2020-03-23 DIAGNOSIS — M1990 Unspecified osteoarthritis, unspecified site: Secondary | ICD-10-CM | POA: Diagnosis not present

## 2020-03-23 DIAGNOSIS — Z885 Allergy status to narcotic agent status: Secondary | ICD-10-CM | POA: Diagnosis not present

## 2020-03-23 DIAGNOSIS — M9261 Juvenile osteochondrosis of tarsus, right ankle: Secondary | ICD-10-CM | POA: Diagnosis not present

## 2020-03-23 DIAGNOSIS — E785 Hyperlipidemia, unspecified: Secondary | ICD-10-CM | POA: Diagnosis not present

## 2020-03-23 DIAGNOSIS — M6788 Other specified disorders of synovium and tendon, other site: Secondary | ICD-10-CM | POA: Diagnosis not present

## 2020-03-23 HISTORY — PX: TOE FUSION: SHX1070

## 2020-04-05 DIAGNOSIS — Z09 Encounter for follow-up examination after completed treatment for conditions other than malignant neoplasm: Secondary | ICD-10-CM | POA: Diagnosis not present

## 2020-04-17 DIAGNOSIS — M859 Disorder of bone density and structure, unspecified: Secondary | ICD-10-CM | POA: Diagnosis not present

## 2020-04-17 DIAGNOSIS — E7800 Pure hypercholesterolemia, unspecified: Secondary | ICD-10-CM | POA: Diagnosis not present

## 2020-04-20 DIAGNOSIS — E7800 Pure hypercholesterolemia, unspecified: Secondary | ICD-10-CM | POA: Diagnosis not present

## 2020-04-20 DIAGNOSIS — Z0000 Encounter for general adult medical examination without abnormal findings: Secondary | ICD-10-CM | POA: Diagnosis not present

## 2020-04-20 DIAGNOSIS — M8580 Other specified disorders of bone density and structure, unspecified site: Secondary | ICD-10-CM | POA: Diagnosis not present

## 2020-04-20 DIAGNOSIS — G43109 Migraine with aura, not intractable, without status migrainosus: Secondary | ICD-10-CM | POA: Diagnosis not present

## 2020-04-20 DIAGNOSIS — M5090 Cervical disc disorder, unspecified, unspecified cervical region: Secondary | ICD-10-CM | POA: Diagnosis not present

## 2020-04-20 DIAGNOSIS — N952 Postmenopausal atrophic vaginitis: Secondary | ICD-10-CM | POA: Diagnosis not present

## 2020-04-20 DIAGNOSIS — M5106 Intervertebral disc disorders with myelopathy, lumbar region: Secondary | ICD-10-CM | POA: Diagnosis not present

## 2020-05-07 DIAGNOSIS — Z981 Arthrodesis status: Secondary | ICD-10-CM | POA: Diagnosis not present

## 2020-05-15 DIAGNOSIS — Z85828 Personal history of other malignant neoplasm of skin: Secondary | ICD-10-CM | POA: Diagnosis not present

## 2020-05-15 DIAGNOSIS — M79671 Pain in right foot: Secondary | ICD-10-CM | POA: Diagnosis not present

## 2020-05-15 DIAGNOSIS — L821 Other seborrheic keratosis: Secondary | ICD-10-CM | POA: Diagnosis not present

## 2020-05-15 DIAGNOSIS — M1611 Unilateral primary osteoarthritis, right hip: Secondary | ICD-10-CM | POA: Diagnosis not present

## 2020-05-15 DIAGNOSIS — M7661 Achilles tendinitis, right leg: Secondary | ICD-10-CM | POA: Diagnosis not present

## 2020-05-15 DIAGNOSIS — M4726 Other spondylosis with radiculopathy, lumbar region: Secondary | ICD-10-CM | POA: Diagnosis not present

## 2020-05-15 DIAGNOSIS — L814 Other melanin hyperpigmentation: Secondary | ICD-10-CM | POA: Diagnosis not present

## 2020-05-15 DIAGNOSIS — D225 Melanocytic nevi of trunk: Secondary | ICD-10-CM | POA: Diagnosis not present

## 2020-05-15 DIAGNOSIS — D1801 Hemangioma of skin and subcutaneous tissue: Secondary | ICD-10-CM | POA: Diagnosis not present

## 2020-05-15 DIAGNOSIS — L578 Other skin changes due to chronic exposure to nonionizing radiation: Secondary | ICD-10-CM | POA: Diagnosis not present

## 2020-05-28 DIAGNOSIS — M79671 Pain in right foot: Secondary | ICD-10-CM | POA: Diagnosis not present

## 2020-05-28 DIAGNOSIS — M4726 Other spondylosis with radiculopathy, lumbar region: Secondary | ICD-10-CM | POA: Diagnosis not present

## 2020-05-28 DIAGNOSIS — M1611 Unilateral primary osteoarthritis, right hip: Secondary | ICD-10-CM | POA: Diagnosis not present

## 2020-05-28 DIAGNOSIS — M7661 Achilles tendinitis, right leg: Secondary | ICD-10-CM | POA: Diagnosis not present

## 2020-05-31 DIAGNOSIS — R202 Paresthesia of skin: Secondary | ICD-10-CM | POA: Diagnosis not present

## 2020-06-14 DIAGNOSIS — M19071 Primary osteoarthritis, right ankle and foot: Secondary | ICD-10-CM | POA: Diagnosis not present

## 2020-06-15 DIAGNOSIS — M7661 Achilles tendinitis, right leg: Secondary | ICD-10-CM | POA: Diagnosis not present

## 2020-06-15 DIAGNOSIS — M4726 Other spondylosis with radiculopathy, lumbar region: Secondary | ICD-10-CM | POA: Diagnosis not present

## 2020-06-15 DIAGNOSIS — M1611 Unilateral primary osteoarthritis, right hip: Secondary | ICD-10-CM | POA: Diagnosis not present

## 2020-06-15 DIAGNOSIS — M79671 Pain in right foot: Secondary | ICD-10-CM | POA: Diagnosis not present

## 2020-06-27 DIAGNOSIS — M79671 Pain in right foot: Secondary | ICD-10-CM | POA: Diagnosis not present

## 2020-06-27 DIAGNOSIS — M1611 Unilateral primary osteoarthritis, right hip: Secondary | ICD-10-CM | POA: Diagnosis not present

## 2020-06-27 DIAGNOSIS — M4726 Other spondylosis with radiculopathy, lumbar region: Secondary | ICD-10-CM | POA: Diagnosis not present

## 2020-06-27 DIAGNOSIS — M7661 Achilles tendinitis, right leg: Secondary | ICD-10-CM | POA: Diagnosis not present

## 2020-06-28 DIAGNOSIS — M47816 Spondylosis without myelopathy or radiculopathy, lumbar region: Secondary | ICD-10-CM | POA: Diagnosis not present

## 2020-06-28 DIAGNOSIS — R030 Elevated blood-pressure reading, without diagnosis of hypertension: Secondary | ICD-10-CM | POA: Diagnosis not present

## 2020-06-29 DIAGNOSIS — M4726 Other spondylosis with radiculopathy, lumbar region: Secondary | ICD-10-CM | POA: Diagnosis not present

## 2020-06-29 DIAGNOSIS — M7661 Achilles tendinitis, right leg: Secondary | ICD-10-CM | POA: Diagnosis not present

## 2020-06-29 DIAGNOSIS — M79671 Pain in right foot: Secondary | ICD-10-CM | POA: Diagnosis not present

## 2020-06-29 DIAGNOSIS — M1611 Unilateral primary osteoarthritis, right hip: Secondary | ICD-10-CM | POA: Diagnosis not present

## 2020-07-02 DIAGNOSIS — M1611 Unilateral primary osteoarthritis, right hip: Secondary | ICD-10-CM | POA: Diagnosis not present

## 2020-07-02 DIAGNOSIS — M4726 Other spondylosis with radiculopathy, lumbar region: Secondary | ICD-10-CM | POA: Diagnosis not present

## 2020-07-02 DIAGNOSIS — M7661 Achilles tendinitis, right leg: Secondary | ICD-10-CM | POA: Diagnosis not present

## 2020-07-02 DIAGNOSIS — M79671 Pain in right foot: Secondary | ICD-10-CM | POA: Diagnosis not present

## 2020-07-04 DIAGNOSIS — M1611 Unilateral primary osteoarthritis, right hip: Secondary | ICD-10-CM | POA: Diagnosis not present

## 2020-07-04 DIAGNOSIS — M79671 Pain in right foot: Secondary | ICD-10-CM | POA: Diagnosis not present

## 2020-07-04 DIAGNOSIS — M4726 Other spondylosis with radiculopathy, lumbar region: Secondary | ICD-10-CM | POA: Diagnosis not present

## 2020-07-04 DIAGNOSIS — M7661 Achilles tendinitis, right leg: Secondary | ICD-10-CM | POA: Diagnosis not present

## 2020-07-09 DIAGNOSIS — M7989 Other specified soft tissue disorders: Secondary | ICD-10-CM | POA: Diagnosis not present

## 2020-07-09 DIAGNOSIS — R52 Pain, unspecified: Secondary | ICD-10-CM | POA: Diagnosis not present

## 2020-07-09 DIAGNOSIS — Z6826 Body mass index (BMI) 26.0-26.9, adult: Secondary | ICD-10-CM | POA: Diagnosis not present

## 2020-07-10 DIAGNOSIS — M1611 Unilateral primary osteoarthritis, right hip: Secondary | ICD-10-CM | POA: Diagnosis not present

## 2020-07-10 DIAGNOSIS — M79671 Pain in right foot: Secondary | ICD-10-CM | POA: Diagnosis not present

## 2020-07-10 DIAGNOSIS — M7661 Achilles tendinitis, right leg: Secondary | ICD-10-CM | POA: Diagnosis not present

## 2020-07-10 DIAGNOSIS — M4726 Other spondylosis with radiculopathy, lumbar region: Secondary | ICD-10-CM | POA: Diagnosis not present

## 2020-07-12 DIAGNOSIS — M4726 Other spondylosis with radiculopathy, lumbar region: Secondary | ICD-10-CM | POA: Diagnosis not present

## 2020-07-12 DIAGNOSIS — M79671 Pain in right foot: Secondary | ICD-10-CM | POA: Diagnosis not present

## 2020-07-12 DIAGNOSIS — M7661 Achilles tendinitis, right leg: Secondary | ICD-10-CM | POA: Diagnosis not present

## 2020-07-12 DIAGNOSIS — Z1231 Encounter for screening mammogram for malignant neoplasm of breast: Secondary | ICD-10-CM | POA: Diagnosis not present

## 2020-07-12 DIAGNOSIS — M1611 Unilateral primary osteoarthritis, right hip: Secondary | ICD-10-CM | POA: Diagnosis not present

## 2020-07-12 DIAGNOSIS — Z803 Family history of malignant neoplasm of breast: Secondary | ICD-10-CM | POA: Diagnosis not present

## 2020-07-16 DIAGNOSIS — M1611 Unilateral primary osteoarthritis, right hip: Secondary | ICD-10-CM | POA: Diagnosis not present

## 2020-07-16 DIAGNOSIS — M4726 Other spondylosis with radiculopathy, lumbar region: Secondary | ICD-10-CM | POA: Diagnosis not present

## 2020-07-16 DIAGNOSIS — M79671 Pain in right foot: Secondary | ICD-10-CM | POA: Diagnosis not present

## 2020-07-16 DIAGNOSIS — M7661 Achilles tendinitis, right leg: Secondary | ICD-10-CM | POA: Diagnosis not present

## 2020-07-18 DIAGNOSIS — M1611 Unilateral primary osteoarthritis, right hip: Secondary | ICD-10-CM | POA: Diagnosis not present

## 2020-07-18 DIAGNOSIS — M7661 Achilles tendinitis, right leg: Secondary | ICD-10-CM | POA: Diagnosis not present

## 2020-07-18 DIAGNOSIS — M4726 Other spondylosis with radiculopathy, lumbar region: Secondary | ICD-10-CM | POA: Diagnosis not present

## 2020-07-18 DIAGNOSIS — M79671 Pain in right foot: Secondary | ICD-10-CM | POA: Diagnosis not present

## 2020-07-23 DIAGNOSIS — M1611 Unilateral primary osteoarthritis, right hip: Secondary | ICD-10-CM | POA: Diagnosis not present

## 2020-07-23 DIAGNOSIS — M4726 Other spondylosis with radiculopathy, lumbar region: Secondary | ICD-10-CM | POA: Diagnosis not present

## 2020-07-23 DIAGNOSIS — M7661 Achilles tendinitis, right leg: Secondary | ICD-10-CM | POA: Diagnosis not present

## 2020-07-23 DIAGNOSIS — M79671 Pain in right foot: Secondary | ICD-10-CM | POA: Diagnosis not present

## 2020-07-25 DIAGNOSIS — M79671 Pain in right foot: Secondary | ICD-10-CM | POA: Diagnosis not present

## 2020-07-25 DIAGNOSIS — M4726 Other spondylosis with radiculopathy, lumbar region: Secondary | ICD-10-CM | POA: Diagnosis not present

## 2020-07-25 DIAGNOSIS — M1611 Unilateral primary osteoarthritis, right hip: Secondary | ICD-10-CM | POA: Diagnosis not present

## 2020-07-25 DIAGNOSIS — M7661 Achilles tendinitis, right leg: Secondary | ICD-10-CM | POA: Diagnosis not present

## 2020-07-26 DIAGNOSIS — M79642 Pain in left hand: Secondary | ICD-10-CM | POA: Diagnosis not present

## 2020-07-26 DIAGNOSIS — M79641 Pain in right hand: Secondary | ICD-10-CM | POA: Diagnosis not present

## 2020-07-26 DIAGNOSIS — M25532 Pain in left wrist: Secondary | ICD-10-CM | POA: Diagnosis not present

## 2020-07-30 DIAGNOSIS — M4726 Other spondylosis with radiculopathy, lumbar region: Secondary | ICD-10-CM | POA: Diagnosis not present

## 2020-07-30 DIAGNOSIS — M7661 Achilles tendinitis, right leg: Secondary | ICD-10-CM | POA: Diagnosis not present

## 2020-07-30 DIAGNOSIS — M79671 Pain in right foot: Secondary | ICD-10-CM | POA: Diagnosis not present

## 2020-07-30 DIAGNOSIS — M1611 Unilateral primary osteoarthritis, right hip: Secondary | ICD-10-CM | POA: Diagnosis not present

## 2020-08-01 DIAGNOSIS — M79671 Pain in right foot: Secondary | ICD-10-CM | POA: Diagnosis not present

## 2020-08-01 DIAGNOSIS — M4726 Other spondylosis with radiculopathy, lumbar region: Secondary | ICD-10-CM | POA: Diagnosis not present

## 2020-08-01 DIAGNOSIS — M7661 Achilles tendinitis, right leg: Secondary | ICD-10-CM | POA: Diagnosis not present

## 2020-08-01 DIAGNOSIS — M1611 Unilateral primary osteoarthritis, right hip: Secondary | ICD-10-CM | POA: Diagnosis not present

## 2020-08-07 DIAGNOSIS — M25561 Pain in right knee: Secondary | ICD-10-CM | POA: Diagnosis not present

## 2020-08-07 DIAGNOSIS — M25522 Pain in left elbow: Secondary | ICD-10-CM | POA: Diagnosis not present

## 2020-08-07 DIAGNOSIS — M47816 Spondylosis without myelopathy or radiculopathy, lumbar region: Secondary | ICD-10-CM | POA: Diagnosis not present

## 2020-08-09 DIAGNOSIS — M1611 Unilateral primary osteoarthritis, right hip: Secondary | ICD-10-CM | POA: Diagnosis not present

## 2020-08-09 DIAGNOSIS — M4726 Other spondylosis with radiculopathy, lumbar region: Secondary | ICD-10-CM | POA: Diagnosis not present

## 2020-08-09 DIAGNOSIS — M7661 Achilles tendinitis, right leg: Secondary | ICD-10-CM | POA: Diagnosis not present

## 2020-08-09 DIAGNOSIS — M79671 Pain in right foot: Secondary | ICD-10-CM | POA: Diagnosis not present

## 2020-08-16 DIAGNOSIS — M79642 Pain in left hand: Secondary | ICD-10-CM | POA: Diagnosis not present

## 2020-08-16 DIAGNOSIS — M25532 Pain in left wrist: Secondary | ICD-10-CM | POA: Diagnosis not present

## 2020-08-16 DIAGNOSIS — M79641 Pain in right hand: Secondary | ICD-10-CM | POA: Diagnosis not present

## 2020-08-30 DIAGNOSIS — M47816 Spondylosis without myelopathy or radiculopathy, lumbar region: Secondary | ICD-10-CM | POA: Diagnosis not present

## 2020-08-30 DIAGNOSIS — M4316 Spondylolisthesis, lumbar region: Secondary | ICD-10-CM | POA: Diagnosis not present

## 2020-09-13 DIAGNOSIS — M791 Myalgia, unspecified site: Secondary | ICD-10-CM | POA: Diagnosis not present

## 2020-09-13 DIAGNOSIS — M9261 Juvenile osteochondrosis of tarsus, right ankle: Secondary | ICD-10-CM | POA: Diagnosis not present

## 2020-09-13 DIAGNOSIS — M6788 Other specified disorders of synovium and tendon, other site: Secondary | ICD-10-CM | POA: Diagnosis not present

## 2020-09-13 DIAGNOSIS — M2021 Hallux rigidus, right foot: Secondary | ICD-10-CM | POA: Diagnosis not present

## 2020-09-19 DIAGNOSIS — M2550 Pain in unspecified joint: Secondary | ICD-10-CM | POA: Diagnosis not present

## 2020-09-19 DIAGNOSIS — M064 Inflammatory polyarthropathy: Secondary | ICD-10-CM | POA: Diagnosis not present

## 2020-09-19 DIAGNOSIS — Z6824 Body mass index (BMI) 24.0-24.9, adult: Secondary | ICD-10-CM | POA: Diagnosis not present

## 2020-09-19 DIAGNOSIS — Z79899 Other long term (current) drug therapy: Secondary | ICD-10-CM | POA: Diagnosis not present

## 2020-09-19 DIAGNOSIS — M150 Primary generalized (osteo)arthritis: Secondary | ICD-10-CM | POA: Diagnosis not present

## 2020-09-19 DIAGNOSIS — R5383 Other fatigue: Secondary | ICD-10-CM | POA: Diagnosis not present

## 2020-09-20 DIAGNOSIS — M47816 Spondylosis without myelopathy or radiculopathy, lumbar region: Secondary | ICD-10-CM | POA: Diagnosis not present

## 2020-09-26 IMAGING — NM NM HEPATO W/GB/PHARM/[PERSON_NAME]
2 series · 12 of 12 positions shown · non-contrast
Comparison: Abdominal ultrasound 06/14/2018

CLINICAL DATA: Right upper quadrant abdominal pain for 4 weeks with
nausea.

EXAM:
NUCLEAR MEDICINE HEPATOBILIARY IMAGING WITH GALLBLADDER EF
TECHNIQUE: Sequential images of the abdomen were obtained [DATE] minutes
following intravenous administration of radiopharmaceutical. After
oral ingestion of Ensure, gallbladder ejection fraction was
determined.
RADIOPHARMACEUTICALS:  5.5 mCi Kc-BBm  Choletec IV

[he hepatobiliary · 4.52mm/px · 6 of 60 frames shown (1 of 2)]
[frame 6/60]
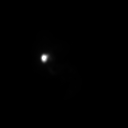
[frame 16/60]
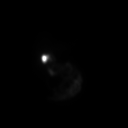
[frame 26/60]
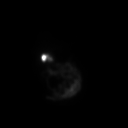
[frame 36/60]
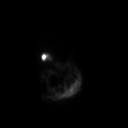
[frame 46/60]
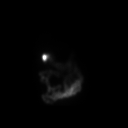
[frame 56/60]
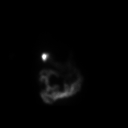

[he hepatobiliary · 4.52mm/px · 6 of 60 frames shown (2 of 2)]
[frame 6/60]
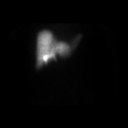
[frame 16/60]
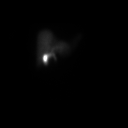
[frame 26/60]
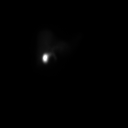
[frame 36/60]
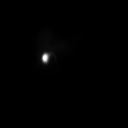
[frame 46/60]
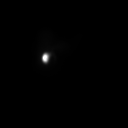
[frame 56/60]
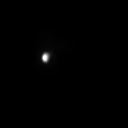

[12 of 12 positions shown; findings below may reference images not displayed]

FINDINGS: Satisfactory uptake of radiopharmaceutical from the blood pool.
Biliary activity visible at 4 minutes. Gallbladder activity likewise
visible at 4 minutes. Bowel activity visible at 60 minutes after
oral administration of Ensure.

Calculated gallbladder ejection fraction is 79%. (Normal gallbladder
ejection fraction with Ensure is greater than 33%.)
IMPRESSION: 1. Normal hepatobiliary scan, with gallbladder ejection fraction of
79%.

## 2020-10-10 DIAGNOSIS — M47816 Spondylosis without myelopathy or radiculopathy, lumbar region: Secondary | ICD-10-CM | POA: Diagnosis not present

## 2020-10-10 DIAGNOSIS — M4316 Spondylolisthesis, lumbar region: Secondary | ICD-10-CM | POA: Diagnosis not present

## 2020-10-18 ENCOUNTER — Other Ambulatory Visit (HOSPITAL_BASED_OUTPATIENT_CLINIC_OR_DEPARTMENT_OTHER): Payer: Self-pay

## 2020-10-18 ENCOUNTER — Ambulatory Visit: Payer: Medicare Other | Attending: Internal Medicine

## 2020-10-18 ENCOUNTER — Other Ambulatory Visit: Payer: Self-pay

## 2020-10-18 DIAGNOSIS — Z23 Encounter for immunization: Secondary | ICD-10-CM

## 2020-10-18 MED ORDER — PFIZER-BIONT COVID-19 VAC-TRIS 30 MCG/0.3ML IM SUSP
INTRAMUSCULAR | 0 refills | Status: DC
  Filled 2020-10-18: qty 0.3, 1d supply, fill #0

## 2020-10-18 NOTE — Progress Notes (Signed)
   Covid-19 Vaccination Clinic  Name:  Mary Harrell    MRN: [id number] DOB: 11-07-1950  10/18/2020  Mary Harrell was observed post Covid-19 immunization for 15 minutes without incident. She was provided with Vaccine Information Sheet and instruction to access the V-Safe system.   Mary Harrell was instructed to call 911 with any severe reactions post vaccine: Marland Kitchen Difficulty breathing  . Swelling of face and throat  . A fast heartbeat  . A bad rash all over body  . Dizziness and weakness   Immunizations Administered    Name Date Dose VIS Date Route   PFIZER Comrnaty(Gray TOP) Covid-19 Vaccine 10/18/2020 12:54 PM 0.3 mL 05/17/2020 Intramuscular   Manufacturer: [place]   Lot: [id number]   [place]: [phone]

## 2020-10-19 IMAGING — US US ABDOMEN LIMITED
1 series · 14 of 25 positions shown · non-contrast
Comparison: 12/19/2007 CT.

CLINICAL DATA: 67-year-old female with epigastric pain, chronic
although worsening over the past 2-3 weeks. Initial encounter.

EXAM:
ULTRASOUND ABDOMEN LIMITED RIGHT UPPER QUADRANT

[Series 1: us abdomen limited · 0.22mm/px · 14 of 39 slices shown]
[im 1/39]
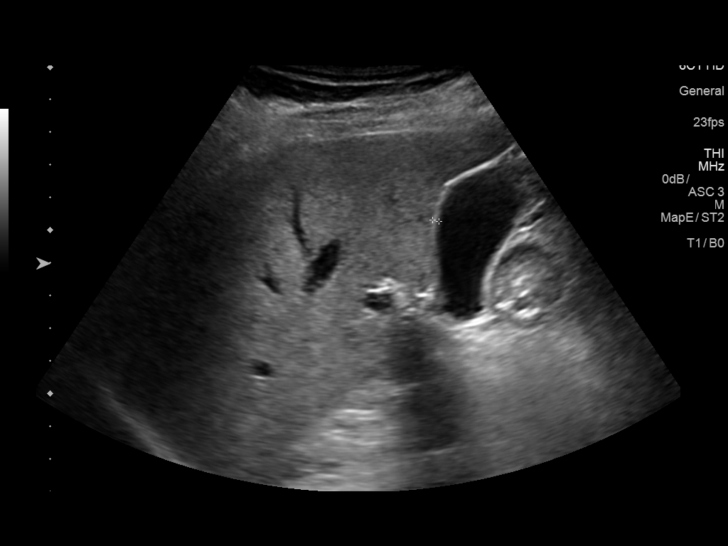
[im 4/39]
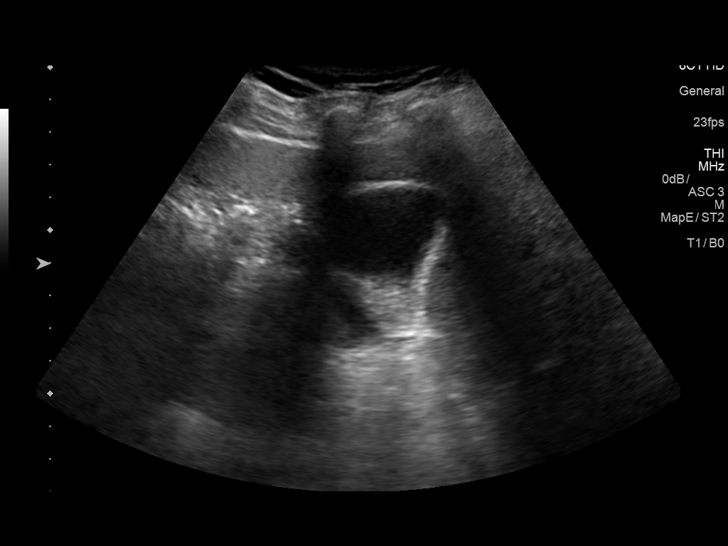
[im 7/39]
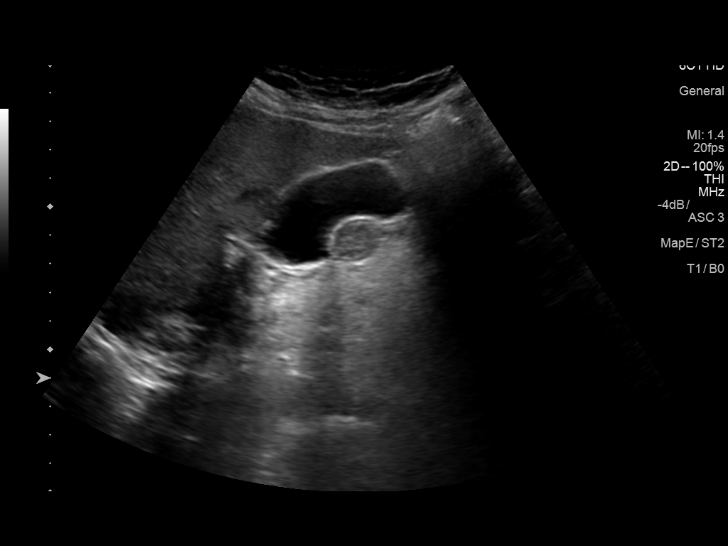
[im 10/39]
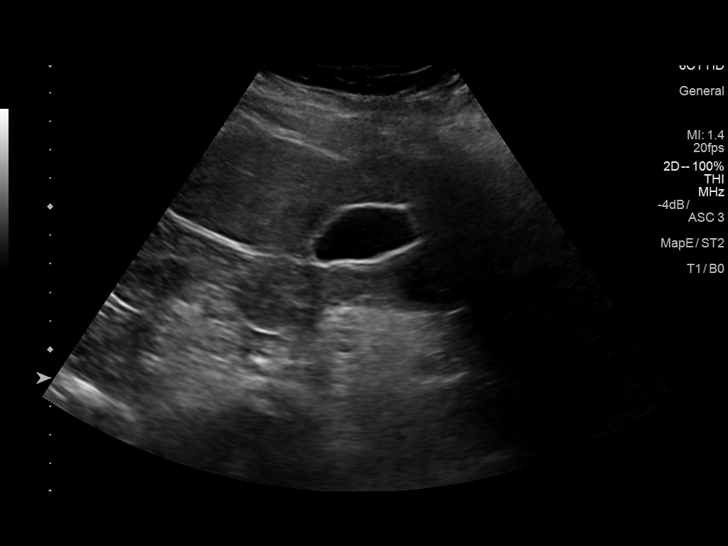
[im 13/39]
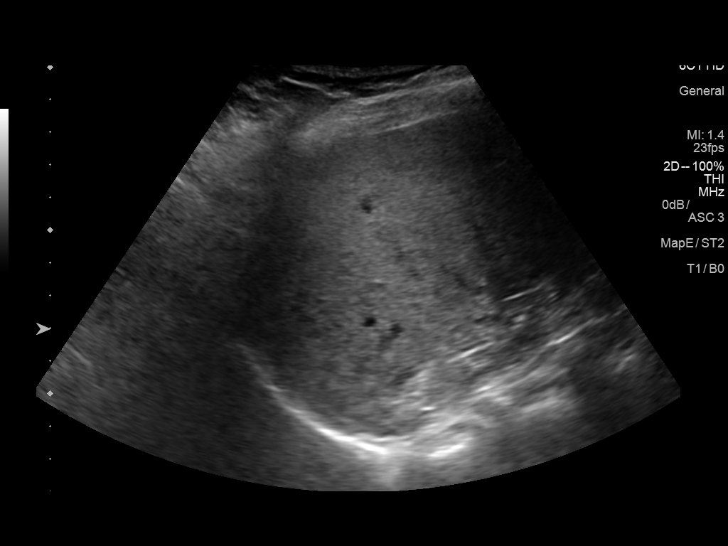
[im 15/39]
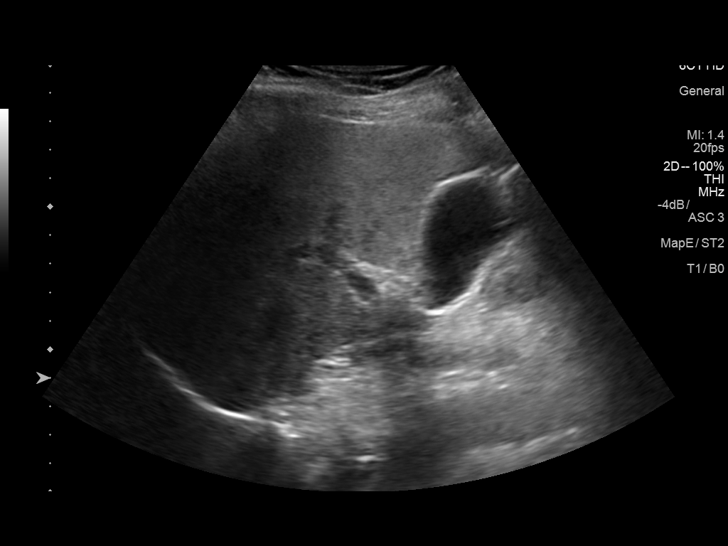
[im 18/39]
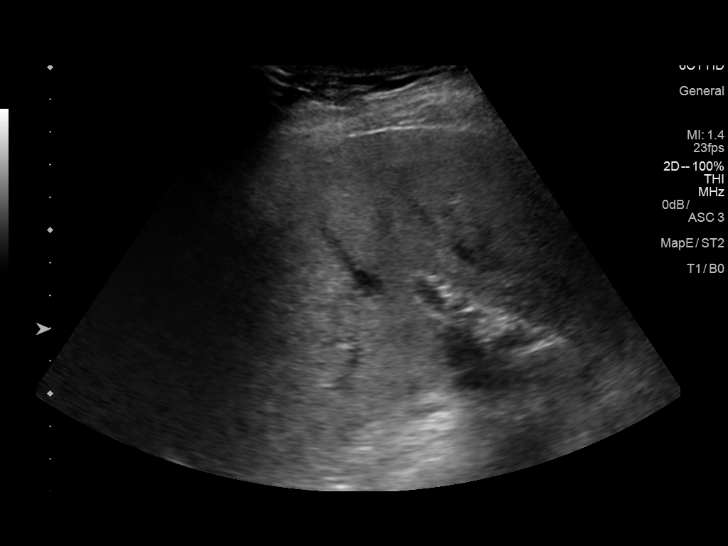
[im 21/39]
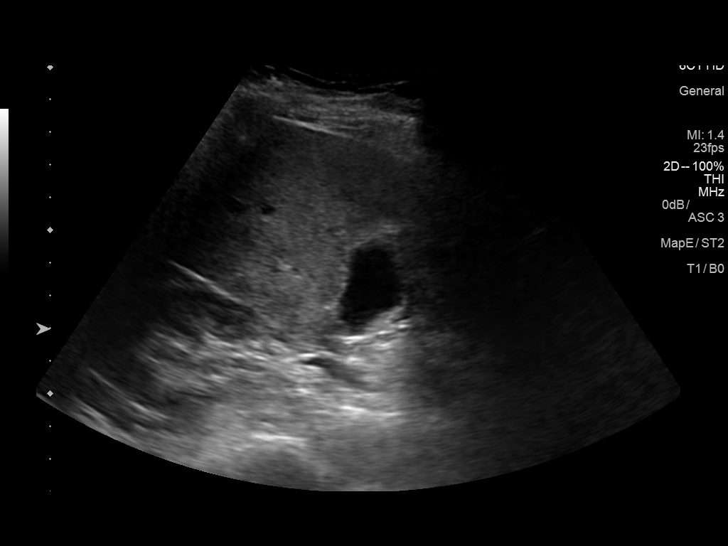
[im 24/39]
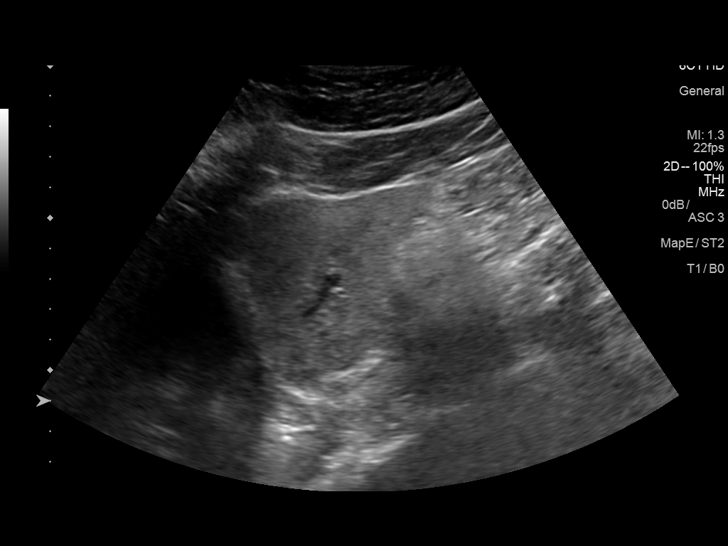
[im 26/39]
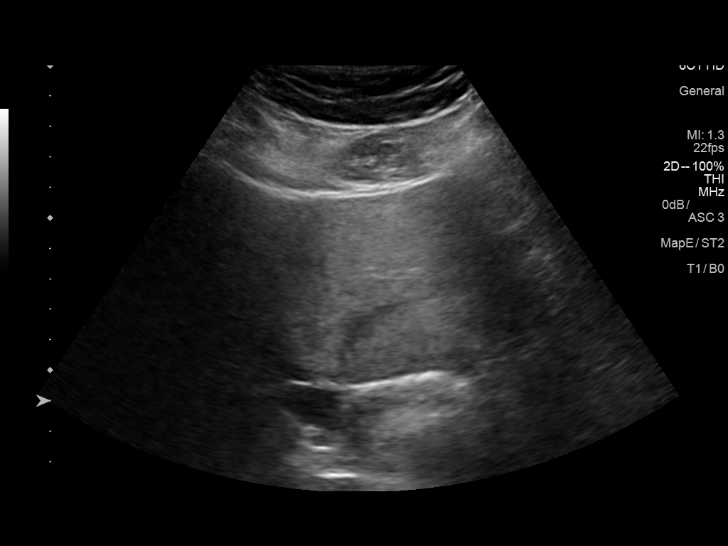
[im 29/39]
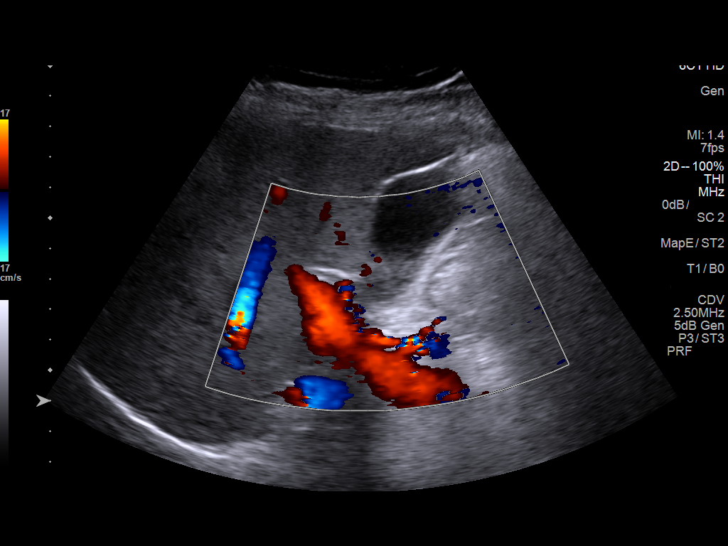
[im 32/39]
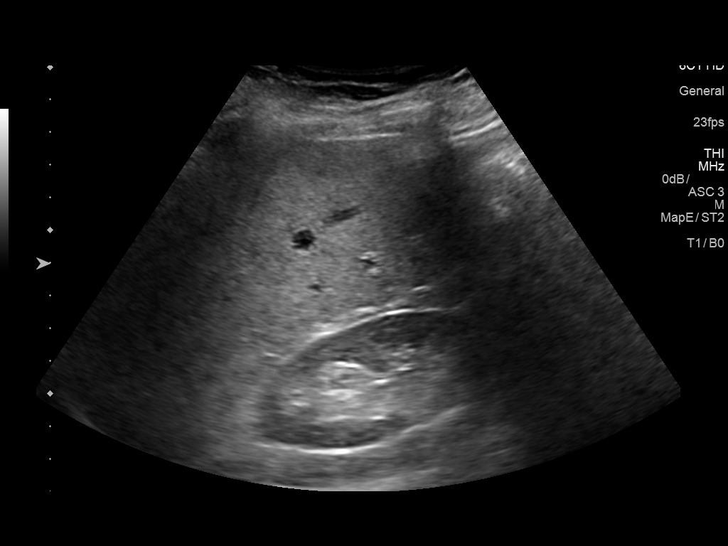
[im 35/39]
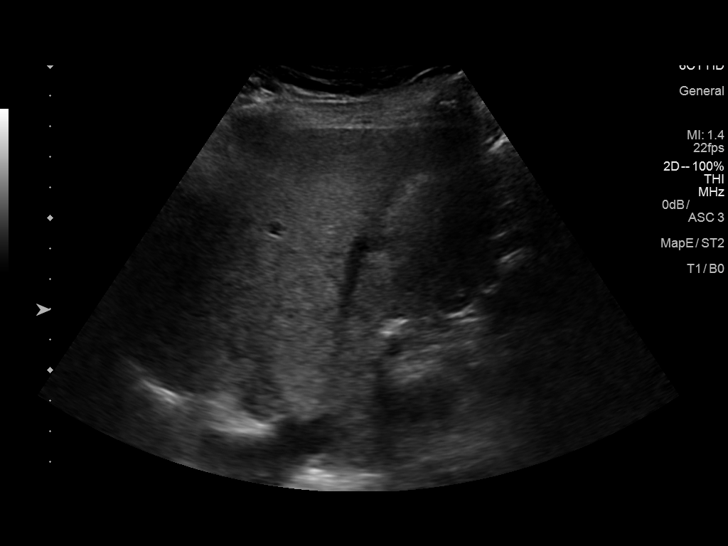
[im 39/39]
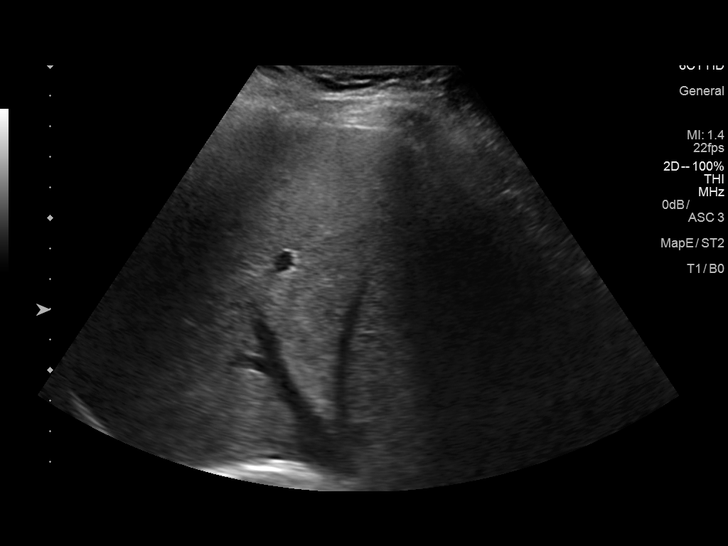

[14 of 25 positions shown; findings below may reference images not displayed]

FINDINGS: Gallbladder:

No gallstones or wall thickening visualized. No sonographic Murphy
sign noted by sonographer.

Common bile duct:

Diameter: 2.2 mm

Liver:

Liver of increased echogenicity consistent with fatty infiltration
and/or hepatocellular disease. No focal hepatic lesion noted. Portal
vein is patent on color Doppler imaging with normal direction of
blood flow towards the liver.
IMPRESSION: 1. Liver of increased echogenicity consistent with fatty
infiltration and/or hepatocellular disease. Given the appearance of
fatty infiltration on 2559 exam, findings may reflect changes of
fatty infiltration. No focal hepatic lesion noted.
2. Otherwise negative right upper quadrant ultrasound.

## 2020-10-21 DIAGNOSIS — U071 COVID-19: Secondary | ICD-10-CM | POA: Diagnosis not present

## 2020-10-30 DIAGNOSIS — M4726 Other spondylosis with radiculopathy, lumbar region: Secondary | ICD-10-CM | POA: Diagnosis not present

## 2020-10-30 DIAGNOSIS — M79671 Pain in right foot: Secondary | ICD-10-CM | POA: Diagnosis not present

## 2020-10-30 DIAGNOSIS — M7661 Achilles tendinitis, right leg: Secondary | ICD-10-CM | POA: Diagnosis not present

## 2020-10-30 DIAGNOSIS — M1611 Unilateral primary osteoarthritis, right hip: Secondary | ICD-10-CM | POA: Diagnosis not present

## 2020-11-08 DIAGNOSIS — Z6824 Body mass index (BMI) 24.0-24.9, adult: Secondary | ICD-10-CM | POA: Diagnosis not present

## 2020-11-08 DIAGNOSIS — M150 Primary generalized (osteo)arthritis: Secondary | ICD-10-CM | POA: Diagnosis not present

## 2020-11-08 DIAGNOSIS — M2550 Pain in unspecified joint: Secondary | ICD-10-CM | POA: Diagnosis not present

## 2020-11-12 DIAGNOSIS — M47816 Spondylosis without myelopathy or radiculopathy, lumbar region: Secondary | ICD-10-CM | POA: Diagnosis not present

## 2020-11-28 DIAGNOSIS — M47816 Spondylosis without myelopathy or radiculopathy, lumbar region: Secondary | ICD-10-CM | POA: Diagnosis not present

## 2020-11-28 DIAGNOSIS — M4316 Spondylolisthesis, lumbar region: Secondary | ICD-10-CM | POA: Diagnosis not present

## 2020-12-14 DIAGNOSIS — N819 Female genital prolapse, unspecified: Secondary | ICD-10-CM | POA: Diagnosis not present

## 2020-12-14 DIAGNOSIS — Z6824 Body mass index (BMI) 24.0-24.9, adult: Secondary | ICD-10-CM | POA: Diagnosis not present

## 2021-01-07 DIAGNOSIS — N816 Rectocele: Secondary | ICD-10-CM | POA: Diagnosis not present

## 2021-01-07 DIAGNOSIS — R350 Frequency of micturition: Secondary | ICD-10-CM | POA: Diagnosis not present

## 2021-02-01 DIAGNOSIS — M47816 Spondylosis without myelopathy or radiculopathy, lumbar region: Secondary | ICD-10-CM | POA: Diagnosis not present

## 2021-02-01 DIAGNOSIS — M47812 Spondylosis without myelopathy or radiculopathy, cervical region: Secondary | ICD-10-CM | POA: Diagnosis not present

## 2021-02-14 DIAGNOSIS — M180 Bilateral primary osteoarthritis of first carpometacarpal joints: Secondary | ICD-10-CM | POA: Diagnosis not present

## 2021-02-14 DIAGNOSIS — M19042 Primary osteoarthritis, left hand: Secondary | ICD-10-CM | POA: Diagnosis not present

## 2021-02-28 DIAGNOSIS — M47812 Spondylosis without myelopathy or radiculopathy, cervical region: Secondary | ICD-10-CM | POA: Diagnosis not present

## 2021-03-05 DIAGNOSIS — H25813 Combined forms of age-related cataract, bilateral: Secondary | ICD-10-CM | POA: Diagnosis not present

## 2021-03-05 DIAGNOSIS — H04123 Dry eye syndrome of bilateral lacrimal glands: Secondary | ICD-10-CM | POA: Diagnosis not present

## 2021-03-05 DIAGNOSIS — H43813 Vitreous degeneration, bilateral: Secondary | ICD-10-CM | POA: Diagnosis not present

## 2021-03-05 DIAGNOSIS — H02834 Dermatochalasis of left upper eyelid: Secondary | ICD-10-CM | POA: Diagnosis not present

## 2021-03-05 DIAGNOSIS — H5213 Myopia, bilateral: Secondary | ICD-10-CM | POA: Diagnosis not present

## 2021-03-06 DIAGNOSIS — M064 Inflammatory polyarthropathy: Secondary | ICD-10-CM | POA: Diagnosis not present

## 2021-03-06 DIAGNOSIS — R5383 Other fatigue: Secondary | ICD-10-CM | POA: Diagnosis not present

## 2021-03-06 DIAGNOSIS — Z6824 Body mass index (BMI) 24.0-24.9, adult: Secondary | ICD-10-CM | POA: Diagnosis not present

## 2021-03-06 DIAGNOSIS — M150 Primary generalized (osteo)arthritis: Secondary | ICD-10-CM | POA: Diagnosis not present

## 2021-03-06 DIAGNOSIS — M2550 Pain in unspecified joint: Secondary | ICD-10-CM | POA: Diagnosis not present

## 2021-03-07 ENCOUNTER — Other Ambulatory Visit (HOSPITAL_BASED_OUTPATIENT_CLINIC_OR_DEPARTMENT_OTHER): Payer: Self-pay

## 2021-03-13 DIAGNOSIS — Z23 Encounter for immunization: Secondary | ICD-10-CM | POA: Diagnosis not present

## 2021-03-19 DIAGNOSIS — R030 Elevated blood-pressure reading, without diagnosis of hypertension: Secondary | ICD-10-CM | POA: Diagnosis not present

## 2021-03-19 DIAGNOSIS — M47812 Spondylosis without myelopathy or radiculopathy, cervical region: Secondary | ICD-10-CM | POA: Diagnosis not present

## 2021-03-19 DIAGNOSIS — M47816 Spondylosis without myelopathy or radiculopathy, lumbar region: Secondary | ICD-10-CM | POA: Diagnosis not present

## 2021-03-21 DIAGNOSIS — M2021 Hallux rigidus, right foot: Secondary | ICD-10-CM | POA: Diagnosis not present

## 2021-03-21 DIAGNOSIS — M19071 Primary osteoarthritis, right ankle and foot: Secondary | ICD-10-CM | POA: Diagnosis not present

## 2021-03-21 DIAGNOSIS — M25551 Pain in right hip: Secondary | ICD-10-CM | POA: Diagnosis not present

## 2021-03-21 DIAGNOSIS — M25552 Pain in left hip: Secondary | ICD-10-CM | POA: Diagnosis not present

## 2021-03-21 DIAGNOSIS — S99921A Unspecified injury of right foot, initial encounter: Secondary | ICD-10-CM | POA: Diagnosis not present

## 2021-03-21 DIAGNOSIS — M9261 Juvenile osteochondrosis of tarsus, right ankle: Secondary | ICD-10-CM | POA: Diagnosis not present

## 2021-03-28 ENCOUNTER — Other Ambulatory Visit (HOSPITAL_BASED_OUTPATIENT_CLINIC_OR_DEPARTMENT_OTHER): Payer: Self-pay

## 2021-03-28 ENCOUNTER — Ambulatory Visit: Payer: Medicare Other | Attending: Internal Medicine

## 2021-03-28 DIAGNOSIS — Z23 Encounter for immunization: Secondary | ICD-10-CM

## 2021-03-28 MED ORDER — PFIZER COVID-19 VAC BIVALENT 30 MCG/0.3ML IM SUSP
INTRAMUSCULAR | 0 refills | Status: DC
  Filled 2021-03-28: qty 0.3, 1d supply, fill #0

## 2021-03-28 NOTE — Progress Notes (Signed)
   Covid-19 Vaccination Clinic  Name:  Mary Harrell    MRN: [id number] DOB: 1951/04/19  03/28/2021  [name] was observed post Covid-19 immunization for 15 minutes without incident. She was provided with Vaccine Information Sheet and instruction to access the V-Safe system.   [name] was instructed to call 911 with any severe reactions post vaccine: Difficulty breathing  Swelling of face and throat  A fast heartbeat  A bad rash all over body  Dizziness and weakness   Immunizations Administered     Name Date Dose VIS Date Route   Pfizer Covid-19 Vaccine Bivalent Booster 03/28/2021  9:55 AM 0.3 mL 02/06/2021 Intramuscular   Manufacturer: [place]   Lot: [place]: [id number]

## 2021-04-15 DIAGNOSIS — Z20822 Contact with and (suspected) exposure to covid-19: Secondary | ICD-10-CM | POA: Diagnosis not present

## 2021-04-18 DIAGNOSIS — M8589 Other specified disorders of bone density and structure, multiple sites: Secondary | ICD-10-CM | POA: Diagnosis not present

## 2021-04-18 DIAGNOSIS — M8580 Other specified disorders of bone density and structure, unspecified site: Secondary | ICD-10-CM | POA: Diagnosis not present

## 2021-04-18 DIAGNOSIS — Z79899 Other long term (current) drug therapy: Secondary | ICD-10-CM | POA: Diagnosis not present

## 2021-04-18 DIAGNOSIS — Z136 Encounter for screening for cardiovascular disorders: Secondary | ICD-10-CM | POA: Diagnosis not present

## 2021-04-18 DIAGNOSIS — E559 Vitamin D deficiency, unspecified: Secondary | ICD-10-CM | POA: Diagnosis not present

## 2021-04-24 DIAGNOSIS — E7800 Pure hypercholesterolemia, unspecified: Secondary | ICD-10-CM | POA: Diagnosis not present

## 2021-04-24 DIAGNOSIS — Z0000 Encounter for general adult medical examination without abnormal findings: Secondary | ICD-10-CM | POA: Diagnosis not present

## 2021-04-24 DIAGNOSIS — M8580 Other specified disorders of bone density and structure, unspecified site: Secondary | ICD-10-CM | POA: Diagnosis not present

## 2021-04-24 DIAGNOSIS — M5106 Intervertebral disc disorders with myelopathy, lumbar region: Secondary | ICD-10-CM | POA: Diagnosis not present

## 2021-04-24 DIAGNOSIS — N952 Postmenopausal atrophic vaginitis: Secondary | ICD-10-CM | POA: Diagnosis not present

## 2021-04-24 DIAGNOSIS — G43109 Migraine with aura, not intractable, without status migrainosus: Secondary | ICD-10-CM | POA: Diagnosis not present

## 2021-05-01 DIAGNOSIS — M064 Inflammatory polyarthropathy: Secondary | ICD-10-CM | POA: Diagnosis not present

## 2021-05-01 DIAGNOSIS — M2550 Pain in unspecified joint: Secondary | ICD-10-CM | POA: Diagnosis not present

## 2021-05-01 DIAGNOSIS — M150 Primary generalized (osteo)arthritis: Secondary | ICD-10-CM | POA: Diagnosis not present

## 2021-05-01 DIAGNOSIS — Z6824 Body mass index (BMI) 24.0-24.9, adult: Secondary | ICD-10-CM | POA: Diagnosis not present

## 2021-05-09 DIAGNOSIS — Z20822 Contact with and (suspected) exposure to covid-19: Secondary | ICD-10-CM | POA: Diagnosis not present

## 2021-05-16 DIAGNOSIS — D1801 Hemangioma of skin and subcutaneous tissue: Secondary | ICD-10-CM | POA: Diagnosis not present

## 2021-05-16 DIAGNOSIS — Z85828 Personal history of other malignant neoplasm of skin: Secondary | ICD-10-CM | POA: Diagnosis not present

## 2021-05-16 DIAGNOSIS — L570 Actinic keratosis: Secondary | ICD-10-CM | POA: Diagnosis not present

## 2021-05-16 DIAGNOSIS — L814 Other melanin hyperpigmentation: Secondary | ICD-10-CM | POA: Diagnosis not present

## 2021-05-16 DIAGNOSIS — L821 Other seborrheic keratosis: Secondary | ICD-10-CM | POA: Diagnosis not present

## 2021-05-16 DIAGNOSIS — D225 Melanocytic nevi of trunk: Secondary | ICD-10-CM | POA: Diagnosis not present

## 2021-05-16 DIAGNOSIS — D2272 Melanocytic nevi of left lower limb, including hip: Secondary | ICD-10-CM | POA: Diagnosis not present

## 2021-05-16 DIAGNOSIS — D692 Other nonthrombocytopenic purpura: Secondary | ICD-10-CM | POA: Diagnosis not present

## 2021-06-09 DIAGNOSIS — C642 Malignant neoplasm of left kidney, except renal pelvis: Secondary | ICD-10-CM

## 2021-06-09 HISTORY — DX: Malignant neoplasm of left kidney, except renal pelvis: C64.2

## 2021-06-20 DIAGNOSIS — N819 Female genital prolapse, unspecified: Secondary | ICD-10-CM | POA: Diagnosis not present

## 2021-06-20 DIAGNOSIS — K5901 Slow transit constipation: Secondary | ICD-10-CM | POA: Diagnosis not present

## 2021-06-20 DIAGNOSIS — N816 Rectocele: Secondary | ICD-10-CM | POA: Diagnosis not present

## 2021-06-20 DIAGNOSIS — N952 Postmenopausal atrophic vaginitis: Secondary | ICD-10-CM | POA: Diagnosis not present

## 2021-06-24 DIAGNOSIS — M47816 Spondylosis without myelopathy or radiculopathy, lumbar region: Secondary | ICD-10-CM | POA: Diagnosis not present

## 2021-07-04 DIAGNOSIS — M2550 Pain in unspecified joint: Secondary | ICD-10-CM | POA: Diagnosis not present

## 2021-07-04 DIAGNOSIS — M150 Primary generalized (osteo)arthritis: Secondary | ICD-10-CM | POA: Diagnosis not present

## 2021-07-04 DIAGNOSIS — M545 Low back pain, unspecified: Secondary | ICD-10-CM | POA: Diagnosis not present

## 2021-07-04 DIAGNOSIS — M0609 Rheumatoid arthritis without rheumatoid factor, multiple sites: Secondary | ICD-10-CM | POA: Diagnosis not present

## 2021-07-04 DIAGNOSIS — Z6823 Body mass index (BMI) 23.0-23.9, adult: Secondary | ICD-10-CM | POA: Diagnosis not present

## 2021-07-15 DIAGNOSIS — Z1231 Encounter for screening mammogram for malignant neoplasm of breast: Secondary | ICD-10-CM | POA: Diagnosis not present

## 2021-07-15 DIAGNOSIS — Z4689 Encounter for fitting and adjustment of other specified devices: Secondary | ICD-10-CM | POA: Diagnosis not present

## 2021-07-16 DIAGNOSIS — Z4689 Encounter for fitting and adjustment of other specified devices: Secondary | ICD-10-CM | POA: Diagnosis not present

## 2021-07-16 DIAGNOSIS — N819 Female genital prolapse, unspecified: Secondary | ICD-10-CM | POA: Diagnosis not present

## 2021-07-17 DIAGNOSIS — E7800 Pure hypercholesterolemia, unspecified: Secondary | ICD-10-CM | POA: Diagnosis not present

## 2021-07-17 DIAGNOSIS — M47816 Spondylosis without myelopathy or radiculopathy, lumbar region: Secondary | ICD-10-CM | POA: Diagnosis not present

## 2021-07-22 DIAGNOSIS — R922 Inconclusive mammogram: Secondary | ICD-10-CM | POA: Diagnosis not present

## 2021-08-13 DIAGNOSIS — M0609 Rheumatoid arthritis without rheumatoid factor, multiple sites: Secondary | ICD-10-CM | POA: Diagnosis not present

## 2021-08-22 DIAGNOSIS — Z20828 Contact with and (suspected) exposure to other viral communicable diseases: Secondary | ICD-10-CM | POA: Diagnosis not present

## 2021-08-26 DIAGNOSIS — M0609 Rheumatoid arthritis without rheumatoid factor, multiple sites: Secondary | ICD-10-CM | POA: Diagnosis not present

## 2021-09-04 DIAGNOSIS — Z20822 Contact with and (suspected) exposure to covid-19: Secondary | ICD-10-CM | POA: Diagnosis not present

## 2021-09-05 DIAGNOSIS — N993 Prolapse of vaginal vault after hysterectomy: Secondary | ICD-10-CM | POA: Diagnosis not present

## 2021-09-05 DIAGNOSIS — N816 Rectocele: Secondary | ICD-10-CM | POA: Diagnosis not present

## 2021-09-05 DIAGNOSIS — N819 Female genital prolapse, unspecified: Secondary | ICD-10-CM | POA: Diagnosis not present

## 2021-09-16 DIAGNOSIS — Z885 Allergy status to narcotic agent status: Secondary | ICD-10-CM | POA: Diagnosis not present

## 2021-09-16 DIAGNOSIS — Z79899 Other long term (current) drug therapy: Secondary | ICD-10-CM | POA: Diagnosis not present

## 2021-09-16 DIAGNOSIS — N816 Rectocele: Secondary | ICD-10-CM | POA: Diagnosis not present

## 2021-09-16 DIAGNOSIS — E785 Hyperlipidemia, unspecified: Secondary | ICD-10-CM | POA: Diagnosis not present

## 2021-09-16 DIAGNOSIS — N993 Prolapse of vaginal vault after hysterectomy: Secondary | ICD-10-CM | POA: Diagnosis not present

## 2021-09-16 DIAGNOSIS — Z886 Allergy status to analgesic agent status: Secondary | ICD-10-CM | POA: Diagnosis not present

## 2021-09-16 DIAGNOSIS — N952 Postmenopausal atrophic vaginitis: Secondary | ICD-10-CM | POA: Diagnosis not present

## 2021-09-16 DIAGNOSIS — N815 Vaginal enterocele: Secondary | ICD-10-CM | POA: Diagnosis not present

## 2021-09-16 DIAGNOSIS — Z888 Allergy status to other drugs, medicaments and biological substances status: Secondary | ICD-10-CM | POA: Diagnosis not present

## 2021-09-16 DIAGNOSIS — Z881 Allergy status to other antibiotic agents status: Secondary | ICD-10-CM | POA: Diagnosis not present

## 2021-09-20 DIAGNOSIS — Z20822 Contact with and (suspected) exposure to covid-19: Secondary | ICD-10-CM | POA: Diagnosis not present

## 2021-09-23 DIAGNOSIS — M545 Low back pain, unspecified: Secondary | ICD-10-CM | POA: Diagnosis not present

## 2021-09-23 DIAGNOSIS — Z79899 Other long term (current) drug therapy: Secondary | ICD-10-CM | POA: Diagnosis not present

## 2021-09-23 DIAGNOSIS — M1991 Primary osteoarthritis, unspecified site: Secondary | ICD-10-CM | POA: Diagnosis not present

## 2021-09-23 DIAGNOSIS — Z6824 Body mass index (BMI) 24.0-24.9, adult: Secondary | ICD-10-CM | POA: Diagnosis not present

## 2021-09-23 DIAGNOSIS — M0609 Rheumatoid arthritis without rheumatoid factor, multiple sites: Secondary | ICD-10-CM | POA: Diagnosis not present

## 2021-09-26 DIAGNOSIS — M47816 Spondylosis without myelopathy or radiculopathy, lumbar region: Secondary | ICD-10-CM | POA: Diagnosis not present

## 2021-10-21 DIAGNOSIS — M25551 Pain in right hip: Secondary | ICD-10-CM | POA: Diagnosis not present

## 2021-10-30 DIAGNOSIS — M25551 Pain in right hip: Secondary | ICD-10-CM | POA: Diagnosis not present

## 2021-11-26 DIAGNOSIS — M545 Low back pain, unspecified: Secondary | ICD-10-CM | POA: Diagnosis not present

## 2021-11-29 DIAGNOSIS — M25551 Pain in right hip: Secondary | ICD-10-CM | POA: Diagnosis not present

## 2021-11-29 DIAGNOSIS — M545 Low back pain, unspecified: Secondary | ICD-10-CM | POA: Diagnosis not present

## 2021-12-03 DIAGNOSIS — M545 Low back pain, unspecified: Secondary | ICD-10-CM | POA: Diagnosis not present

## 2021-12-04 ENCOUNTER — Other Ambulatory Visit (HOSPITAL_BASED_OUTPATIENT_CLINIC_OR_DEPARTMENT_OTHER): Payer: Self-pay | Admitting: Family Medicine

## 2021-12-04 DIAGNOSIS — N2889 Other specified disorders of kidney and ureter: Secondary | ICD-10-CM

## 2021-12-05 ENCOUNTER — Ambulatory Visit (HOSPITAL_BASED_OUTPATIENT_CLINIC_OR_DEPARTMENT_OTHER)
Admission: RE | Admit: 2021-12-05 | Discharge: 2021-12-05 | Disposition: A | Discharge status: Home / Self Care | Payer: Medicare Other | Source: Ambulatory Visit | Attending: Family Medicine | Admitting: Family Medicine

## 2021-12-05 DIAGNOSIS — N289 Disorder of kidney and ureter, unspecified: Secondary | ICD-10-CM | POA: Diagnosis not present

## 2021-12-05 DIAGNOSIS — N2889 Other specified disorders of kidney and ureter: Secondary | ICD-10-CM | POA: Insufficient documentation

## 2021-12-05 MED ORDER — GADOBUTROL 1 MMOL/ML IV SOLN
6.8000 mL | Freq: Once | INTRAVENOUS | Status: AC | PRN
  Administered 2021-12-05: 6.8 mL via INTRAVENOUS
  Filled 2021-12-05: qty 7.5

## 2021-12-06 DIAGNOSIS — M1611 Unilateral primary osteoarthritis, right hip: Secondary | ICD-10-CM | POA: Diagnosis not present

## 2021-12-24 DIAGNOSIS — M5136 Other intervertebral disc degeneration, lumbar region: Secondary | ICD-10-CM | POA: Diagnosis not present

## 2021-12-24 DIAGNOSIS — M0609 Rheumatoid arthritis without rheumatoid factor, multiple sites: Secondary | ICD-10-CM | POA: Diagnosis not present

## 2021-12-24 DIAGNOSIS — Z6824 Body mass index (BMI) 24.0-24.9, adult: Secondary | ICD-10-CM | POA: Diagnosis not present

## 2021-12-24 DIAGNOSIS — Z79899 Other long term (current) drug therapy: Secondary | ICD-10-CM | POA: Diagnosis not present

## 2021-12-24 DIAGNOSIS — M1991 Primary osteoarthritis, unspecified site: Secondary | ICD-10-CM | POA: Diagnosis not present

## 2021-12-24 DIAGNOSIS — M25551 Pain in right hip: Secondary | ICD-10-CM | POA: Diagnosis not present

## 2021-12-26 DIAGNOSIS — N202 Calculus of kidney with calculus of ureter: Secondary | ICD-10-CM | POA: Diagnosis not present

## 2021-12-26 DIAGNOSIS — C642 Malignant neoplasm of left kidney, except renal pelvis: Secondary | ICD-10-CM | POA: Diagnosis not present

## 2021-12-27 ENCOUNTER — Other Ambulatory Visit: Payer: Self-pay | Admitting: Urology

## 2022-01-02 DIAGNOSIS — M25551 Pain in right hip: Secondary | ICD-10-CM | POA: Diagnosis not present

## 2022-01-08 DIAGNOSIS — M25551 Pain in right hip: Secondary | ICD-10-CM | POA: Diagnosis not present

## 2022-01-13 DIAGNOSIS — M25551 Pain in right hip: Secondary | ICD-10-CM | POA: Diagnosis not present

## 2022-01-13 DIAGNOSIS — M47816 Spondylosis without myelopathy or radiculopathy, lumbar region: Secondary | ICD-10-CM | POA: Diagnosis not present

## 2022-01-24 DIAGNOSIS — M0609 Rheumatoid arthritis without rheumatoid factor, multiple sites: Secondary | ICD-10-CM | POA: Diagnosis not present

## 2022-01-24 DIAGNOSIS — Z79899 Other long term (current) drug therapy: Secondary | ICD-10-CM | POA: Diagnosis not present

## 2022-01-27 DIAGNOSIS — C642 Malignant neoplasm of left kidney, except renal pelvis: Secondary | ICD-10-CM | POA: Diagnosis not present

## 2022-01-29 NOTE — Patient Instructions (Signed)
DUE TO COVID-19 ONLY TWO VISITORS  (aged 73 and older)  ARE ALLOWED TO COME WITH YOU AND STAY IN THE WAITING ROOM ONLY DURING PRE OP AND PROCEDURE.   **NO VISITORS ARE ALLOWED IN THE SHORT STAY AREA OR RECOVERY ROOM!!**  IF YOU WILL BE ADMITTED INTO THE HOSPITAL YOU ARE ALLOWED ONLY FOUR SUPPORT PEOPLE DURING VISITATION HOURS ONLY (7 AM -8PM)   The support person(s) must pass our screening, gel in and out, and wear a mask at all times, including in the patient's room. Patients must also wear a mask when staff or their support person are in the room. Visitors GUEST BADGE MUST BE WORN VISIBLY  One adult visitor may remain with you overnight and MUST be in the room by 8 P.M.     Your procedure is scheduled on: 02/12/22   Report to [facility] Main Entrance    Report to admitting at : 6:15 AM   Call this number if you have problems the morning of surgery [phone]   Clear liquids diet starting the day before surgery until : 5:30 AM DAY OF SURGERY  Water Black Coffee (sugar ok, NO MILK/CREAM OR CREAMERS)  Tea (sugar ok, NO MILK/CREAM OR CREAMERS) regular and decaf                             Plain Jell-O (NO RED)                                           Fruit ices (not with fruit pulp, NO RED)                                     Popsicles (NO RED)                                                                  Juice: apple, WHITE grape, WHITE cranberry Sports drinks like Gatorade (NO RED)  FOLLOW BOWEL PREP AND ANY ADDITIONAL PRE OP INSTRUCTIONS YOU RECEIVED FROM YOUR SURGEON'S OFFICE!!!   Oral Hygiene is also important to reduce your risk of infection.                                    Remember - BRUSH YOUR TEETH THE MORNING OF SURGERY WITH YOUR REGULAR TOOTHPASTE   Do NOT smoke after Midnight   Take these medicines the morning of surgery with A SIP OF WATER: sertraline.Sumatriptan as needed.Use eye drops as usual.  DO NOT TAKE ANY ORAL DIABETIC MEDICATIONS DAY OF YOUR  SURGERY  Bring CPAP mask and tubing day of surgery.                              You may not have any metal on your body including hair pins, jewelry, and body piercing             Do not wear make-up,  lotions, powders, perfumes/cologne, or deodorant  Do not wear nail polish including gel and S&S, artificial/acrylic nails, or any other type of covering on natural nails including finger and toenails. If you have artificial nails, gel coating, etc. that needs to be removed by a nail salon please have this removed prior to surgery or surgery may need to be canceled/ delayed if the surgeon/ anesthesia feels like they are unable to be safely monitored.   Do not shave  48 hours prior to surgery.   Do not bring valuables to the hospital. [place].   Contacts, dentures or bridgework may not be worn into surgery.   Bring small overnight bag day of surgery.   DO NOT [place]. PHARMACY WILL DISPENSE MEDICATIONS LISTED ON YOUR MEDICATION LIST TO YOU DURING YOUR ADMISSION [place]!    Patients discharged on the day of surgery will not be allowed to drive home.  Someone NEEDS to stay with you for the first 24 hours after anesthesia.   Special Instructions: Bring a copy of your healthcare power of attorney and living will documents         the day of surgery if you haven't scanned them before.              Please read over the following fact sheets you were given: IF YOU HAVE QUESTIONS ABOUT YOUR PRE-OP INSTRUCTIONS PLEASE CALL [phone]     [facility] - Preparing for Surgery Before surgery, you can play an important role.  Because skin is not sterile, your skin needs to be as free of germs as possible.  You can reduce the number of germs on your skin by washing with CHG (chlorahexidine gluconate) soap before surgery.  CHG is an antiseptic cleaner which kills germs and bonds with the skin to continue killing  germs even after washing. Please DO NOT use if you have an allergy to CHG or antibacterial soaps.  If your skin becomes reddened/irritated stop using the CHG and inform your nurse when you arrive at Short Stay. Do not shave (including legs and underarms) for at least 48 hours prior to the first CHG shower.  You may shave your face/neck. Please follow these instructions carefully:  1.  Shower with CHG Soap the night before surgery and the  morning of Surgery.  2.  If you choose to wash your hair, wash your hair first as usual with your  normal  shampoo.  3.  After you shampoo, rinse your hair and body thoroughly to remove the  shampoo.                           4.  Use CHG as you would any other liquid soap.  You can apply chg directly  to the skin and wash                       Gently with a scrungie or clean washcloth.  5.  Apply the CHG Soap to your body ONLY FROM THE NECK DOWN.   Do not use on face/ open                           Wound or open sores. Avoid contact with eyes, ears mouth and genitals (  private parts).                       Wash face,  Genitals (private parts) with your normal soap.             6.  Wash thoroughly, paying special attention to the area where your surgery  will be performed.  7.  Thoroughly rinse your body with warm water from the neck down.  8.  DO NOT shower/wash with your normal soap after using and rinsing off  the CHG Soap.                9.  Pat yourself dry with a clean towel.            10.  Wear clean pajamas.            11.  Place clean sheets on your bed the night of your first shower and do not  sleep with pets. Day of Surgery : Do not apply any lotions/deodorants the morning of surgery.  Please wear clean clothes to the hospital/surgery center.  FAILURE TO FOLLOW THESE INSTRUCTIONS MAY RESULT IN THE CANCELLATION OF YOUR SURGERY PATIENT SIGNATURE_________________________________  NURSE  SIGNATURE__________________________________  ________________________________________________________________________

## 2022-01-30 ENCOUNTER — Encounter (HOSPITAL_COMMUNITY)
Admission: RE | Admit: 2022-01-30 | Discharge: 2022-01-30 | Disposition: A | Discharge status: Home / Self Care | Payer: Medicare Other | Source: Ambulatory Visit | Attending: Urology | Admitting: Urology

## 2022-01-30 ENCOUNTER — Other Ambulatory Visit: Payer: Self-pay

## 2022-01-30 ENCOUNTER — Encounter (HOSPITAL_COMMUNITY): Payer: Self-pay

## 2022-01-30 VITALS — BP 132/76 | HR 57 | Temp 98.0°F | Ht 63.0 in | Wt 136.0 lb

## 2022-01-30 DIAGNOSIS — I2510 Atherosclerotic heart disease of native coronary artery without angina pectoris: Secondary | ICD-10-CM | POA: Diagnosis not present

## 2022-01-30 DIAGNOSIS — Z01818 Encounter for other preprocedural examination: Secondary | ICD-10-CM | POA: Diagnosis not present

## 2022-01-30 HISTORY — DX: Other specified postprocedural states: Z98.890

## 2022-01-30 HISTORY — DX: Anemia, unspecified: D64.9

## 2022-01-30 LAB — TYPE AND SCREEN
ABO/RH(D): A POS
Antibody Screen: NEGATIVE

## 2022-01-30 LAB — BASIC METABOLIC PANEL
Anion gap: 5 (ref 5–15)
BUN: 17 mg/dL (ref 8–23)
CO2: 26 mmol/L (ref 22–32)
Calcium: 9.1 mg/dL (ref 8.9–10.3)
Chloride: 110 mmol/L (ref 98–111)
Creatinine, Ser: 0.84 mg/dL (ref 0.44–1.00)
GFR, Estimated: >60 mL/min (ref >60)
Glucose, Bld: 99 mg/dL (ref 70–99)
Potassium: 4.3 mmol/L (ref 3.5–5.1)
Sodium: 141 mmol/L (ref 135–145)

## 2022-01-30 LAB — CBC
HCT: 40.8 % (ref 36.0–46.0)
Hemoglobin: 13.7 g/dL (ref 12.0–15.0)
MCH: 33.5 pg (ref 26.0–34.0)
MCHC: 33.6 g/dL (ref 30.0–36.0)
MCV: 99.8 fL (ref 80.0–100.0)
Platelets: 195 10*3/uL (ref 150–400)
RBC: 4.09 MIL/uL (ref 3.87–5.11)
RDW: 13.1 % (ref 11.5–15.5)
WBC: 4.2 10*3/uL (ref 4.0–10.5)
nRBC: 0 % (ref 0.0–0.2)

## 2022-01-30 NOTE — Progress Notes (Signed)
For Short Stay: [place] appointment date: Date of COVID positive in last 20 days:  Bowel Prep reminder:   For Anesthesia: PCP - Dr. [name] Cardiologist -   Chest x-ray -  EKG -  Stress Test -  ECHO -  Cardiac Cath -  Pacemaker/ICD device last checked: Pacemaker orders received: Device Rep notified:  Spinal Cord Stimulator:  Sleep Study -  CPAP -   Fasting Blood Sugar -  Checks Blood Sugar _____ times a day Date and result of last Hgb A1c-  Blood Thinner Instructions: Aspirin Instructions: Last Dose:  Activity level: Can go up a flight of stairs and activities of daily living without stopping and without chest pain and/or shortness of breath   Able to exercise without chest pain and/or shortness of breath   Unable to go up a flight of stairs without chest pain and/or shortness of breath     Anesthesia review: Hx: Mitral Valve Prolapse.  Patient denies shortness of breath, fever, cough and chest pain at PAT appointment   Patient verbalized understanding of instructions that were given to them at the PAT appointment. Patient was also instructed that they will need to review over the PAT instructions again at home before surgery.

## 2022-01-31 ENCOUNTER — Other Ambulatory Visit (HOSPITAL_COMMUNITY): Payer: Medicare Other

## 2022-02-04 NOTE — Anesthesia Preprocedure Evaluation (Signed)
Anesthesia Evaluation  Patient identified by MRN, date of birth, ID band Patient awake    Reviewed: Allergy & Precautions, NPO status , Patient's Chart, lab work & pertinent test results  History of Anesthesia Complications (+) PONV and history of anesthetic complications  Airway Mallampati: II  TM Distance: >3 FB Neck ROM: Full    Dental no notable dental hx. (+) Teeth Intact, Dental Advisory Given   Pulmonary    Pulmonary exam normal breath sounds clear to auscultation       Cardiovascular + Peripheral Vascular Disease  Normal cardiovascular exam Rhythm:Regular Rate:Normal     Neuro/Psych  Headaches, PSYCHIATRIC DISORDERS Anxiety Depression    GI/Hepatic GERD  ,  Endo/Other    Renal/GU L Renal mass     Musculoskeletal  (+) Arthritis , Osteoarthritis and Rheumatoid disorders,    Abdominal   Peds  Hematology Lab Results      Component                Value               Date                           HGB                      13.7                01/30/2022                HCT                      40.8                01/30/2022                  PLT                      195                 01/30/2022              Anesthesia Other Findings All: See list Cipro, sulfa  Reproductive/Obstetrics                            Anesthesia Physical Anesthesia Plan  ASA: 3  Anesthesia Plan: General   Post-op Pain Management: Lidocaine infusion*, Ofirmev IV (intra-op)* and Ketamine IV*   Induction: Intravenous  PONV Risk Score and Plan: 4 or greater and Treatment may vary due to age or medical condition, Scopolamine patch - Pre-op, Midazolam, Ondansetron, Aprepitant, TIVA and Propofol infusion  Airway Management Planned: Oral ETT  Additional Equipment: None  Intra-op Plan:   Post-operative Plan: Extubation in OR  Informed Consent: I have reviewed the patients History and Physical, chart, labs  and discussed the procedure including the risks, benefits and alternatives for the proposed anesthesia with the patient or authorized representative who has indicated his/her understanding and acceptance.     Dental advisory given  Plan Discussed with:   Anesthesia Plan Comments:       Anesthesia Quick Evaluation

## 2022-02-05 ENCOUNTER — Encounter (HOSPITAL_COMMUNITY)
Admission: RE | Disposition: A | Discharge status: Home / Self Care | Payer: Self-pay | Source: Home / Self Care | Attending: Urology

## 2022-02-05 ENCOUNTER — Other Ambulatory Visit: Payer: Self-pay

## 2022-02-05 ENCOUNTER — Encounter (HOSPITAL_COMMUNITY): Payer: Self-pay | Admitting: Urology

## 2022-02-05 ENCOUNTER — Inpatient Hospital Stay (HOSPITAL_COMMUNITY): Payer: Medicare Other | Admitting: Anesthesiology

## 2022-02-05 ENCOUNTER — Inpatient Hospital Stay (HOSPITAL_COMMUNITY)
Admission: RE | Admit: 2022-02-05 | Discharge: 2022-02-07 | DRG: 658 | Disposition: A | Discharge status: Home / Self Care | Payer: Medicare Other | Attending: Urology | Admitting: Urology

## 2022-02-05 ENCOUNTER — Inpatient Hospital Stay (HOSPITAL_COMMUNITY): Payer: Medicare Other | Admitting: Physician Assistant

## 2022-02-05 DIAGNOSIS — D4102 Neoplasm of uncertain behavior of left kidney: Secondary | ICD-10-CM | POA: Diagnosis not present

## 2022-02-05 DIAGNOSIS — Z885 Allergy status to narcotic agent status: Secondary | ICD-10-CM

## 2022-02-05 DIAGNOSIS — D3610 Benign neoplasm of peripheral nerves and autonomic nervous system, unspecified: Secondary | ICD-10-CM | POA: Diagnosis not present

## 2022-02-05 DIAGNOSIS — F32A Depression, unspecified: Secondary | ICD-10-CM | POA: Diagnosis present

## 2022-02-05 DIAGNOSIS — Z886 Allergy status to analgesic agent status: Secondary | ICD-10-CM | POA: Diagnosis not present

## 2022-02-05 DIAGNOSIS — N2889 Other specified disorders of kidney and ureter: Secondary | ICD-10-CM

## 2022-02-05 DIAGNOSIS — M545 Low back pain, unspecified: Secondary | ICD-10-CM | POA: Diagnosis present

## 2022-02-05 DIAGNOSIS — R590 Localized enlarged lymph nodes: Secondary | ICD-10-CM | POA: Diagnosis present

## 2022-02-05 DIAGNOSIS — Z85828 Personal history of other malignant neoplasm of skin: Secondary | ICD-10-CM | POA: Diagnosis not present

## 2022-02-05 DIAGNOSIS — I739 Peripheral vascular disease, unspecified: Secondary | ICD-10-CM | POA: Diagnosis not present

## 2022-02-05 DIAGNOSIS — Z981 Arthrodesis status: Secondary | ICD-10-CM

## 2022-02-05 DIAGNOSIS — Z888 Allergy status to other drugs, medicaments and biological substances status: Secondary | ICD-10-CM

## 2022-02-05 DIAGNOSIS — Z79899 Other long term (current) drug therapy: Secondary | ICD-10-CM

## 2022-02-05 DIAGNOSIS — E669 Obesity, unspecified: Secondary | ICD-10-CM | POA: Diagnosis present

## 2022-02-05 DIAGNOSIS — C642 Malignant neoplasm of left kidney, except renal pelvis: Principal | ICD-10-CM | POA: Diagnosis present

## 2022-02-05 DIAGNOSIS — Z882 Allergy status to sulfonamides status: Secondary | ICD-10-CM

## 2022-02-05 DIAGNOSIS — Z6824 Body mass index (BMI) 24.0-24.9, adult: Secondary | ICD-10-CM

## 2022-02-05 DIAGNOSIS — G43909 Migraine, unspecified, not intractable, without status migrainosus: Secondary | ICD-10-CM | POA: Diagnosis present

## 2022-02-05 DIAGNOSIS — Z90710 Acquired absence of both cervix and uterus: Secondary | ICD-10-CM | POA: Diagnosis not present

## 2022-02-05 DIAGNOSIS — M069 Rheumatoid arthritis, unspecified: Secondary | ICD-10-CM | POA: Diagnosis present

## 2022-02-05 DIAGNOSIS — F418 Other specified anxiety disorders: Secondary | ICD-10-CM | POA: Diagnosis not present

## 2022-02-05 DIAGNOSIS — Z79631 Long term (current) use of antimetabolite agent: Secondary | ICD-10-CM

## 2022-02-05 DIAGNOSIS — F419 Anxiety disorder, unspecified: Secondary | ICD-10-CM | POA: Diagnosis present

## 2022-02-05 DIAGNOSIS — Z87442 Personal history of urinary calculi: Secondary | ICD-10-CM

## 2022-02-05 DIAGNOSIS — Z881 Allergy status to other antibiotic agents status: Secondary | ICD-10-CM

## 2022-02-05 HISTORY — PX: ROBOT ASSISTED LAPAROSCOPIC NEPHRECTOMY: SHX5140

## 2022-02-05 HISTORY — PX: LYMPH NODE DISSECTION: SHX5087

## 2022-02-05 LAB — ABO/RH: ABO/RH(D): A POS

## 2022-02-05 SURGERY — NEPHRECTOMY, RADICAL, ROBOT-ASSISTED, LAPAROSCOPIC, ADULT
Anesthesia: General | Site: Renal | Laterality: Left

## 2022-02-05 MED ORDER — EPHEDRINE SULFATE-NACL 50-0.9 MG/10ML-% IV SOSY
PREFILLED_SYRINGE | INTRAVENOUS | Status: DC | PRN
  Administered 2022-02-05: 10 mg via INTRAVENOUS

## 2022-02-05 MED ORDER — CEFAZOLIN SODIUM-DEXTROSE 2-4 GM/100ML-% IV SOLN
INTRAVENOUS | Status: AC
  Filled 2022-02-05: qty 100

## 2022-02-05 MED ORDER — MORPHINE SULFATE (PF) 2 MG/ML IV SOLN: 2.0000 mg | INTRAVENOUS | Status: DC | PRN

## 2022-02-05 MED ORDER — DEXTROSE-NACL 5-0.45 % IV SOLN: INTRAVENOUS | Status: DC

## 2022-02-05 MED ORDER — SCOPOLAMINE 1 MG/3DAYS TD PT72
1.0000 | MEDICATED_PATCH | TRANSDERMAL | Status: DC
  Administered 2022-02-05: 1.5 mg via TRANSDERMAL

## 2022-02-05 MED ORDER — FENTANYL CITRATE (PF) 250 MCG/5ML IJ SOLN
INTRAMUSCULAR | Status: AC
  Filled 2022-02-05: qty 5

## 2022-02-05 MED ORDER — ONDANSETRON HCL 4 MG/2ML IJ SOLN
INTRAMUSCULAR | Status: DC | PRN
  Administered 2022-02-05: 4 mg via INTRAVENOUS

## 2022-02-05 MED ORDER — CEFAZOLIN SODIUM-DEXTROSE 2-4 GM/100ML-% IV SOLN
2.0000 g | INTRAVENOUS | Status: AC
  Administered 2022-02-05: 2 g via INTRAVENOUS

## 2022-02-05 MED ORDER — KETAMINE HCL 10 MG/ML IJ SOLN
INTRAMUSCULAR | Status: DC | PRN
  Administered 2022-02-05 (×2): 10 mg via INTRAVENOUS

## 2022-02-05 MED ORDER — PHENYLEPHRINE 80 MCG/ML (10ML) SYRINGE FOR IV PUSH (FOR BLOOD PRESSURE SUPPORT)
PREFILLED_SYRINGE | INTRAVENOUS | Status: AC
  Filled 2022-02-05: qty 10

## 2022-02-05 MED ORDER — LACTATED RINGERS IV SOLN: INTRAVENOUS | Status: DC

## 2022-02-05 MED ORDER — ONDANSETRON HCL 4 MG/2ML IJ SOLN: 4.0000 mg | Freq: Once | INTRAMUSCULAR | Status: DC | PRN

## 2022-02-05 MED ORDER — SODIUM CHLORIDE (PF) 0.9 % IJ SOLN
INTRAMUSCULAR | Status: DC | PRN
  Administered 2022-02-05: 20 mL

## 2022-02-05 MED ORDER — AMISULPRIDE (ANTIEMETIC) 5 MG/2ML IV SOLN
10.0000 mg | Freq: Once | INTRAVENOUS | Status: DC | PRN
Start: 2022-02-05 — End: 2022-02-05

## 2022-02-05 MED ORDER — LIDOCAINE 2% (20 MG/ML) 5 ML SYRINGE
INTRAMUSCULAR | Status: DC | PRN
  Administered 2022-02-05: 100 mg via INTRAVENOUS

## 2022-02-05 MED ORDER — ACETAMINOPHEN 10 MG/ML IV SOLN
1000.0000 mg | Freq: Once | INTRAVENOUS | Status: DC | PRN
Start: 2022-02-05 — End: 2022-02-05

## 2022-02-05 MED ORDER — PROPOFOL 500 MG/50ML IV EMUL
INTRAVENOUS | Status: DC | PRN
  Administered 2022-02-05: 100 ug/kg/min via INTRAVENOUS

## 2022-02-05 MED ORDER — STERILE WATER FOR IRRIGATION IR SOLN
Status: DC | PRN
  Administered 2022-02-05: 1000 mL

## 2022-02-05 MED ORDER — LIDOCAINE HCL 2 % IJ SOLN
INTRAMUSCULAR | Status: AC
  Filled 2022-02-05: qty 20

## 2022-02-05 MED ORDER — PROPOFOL 1000 MG/100ML IV EMUL
INTRAVENOUS | Status: AC
  Filled 2022-02-05: qty 100

## 2022-02-05 MED ORDER — OXYCODONE HCL 5 MG PO TABS: 5.0000 mg | ORAL_TABLET | Freq: Once | ORAL | Status: DC | PRN

## 2022-02-05 MED ORDER — DEXAMETHASONE SODIUM PHOSPHATE 10 MG/ML IJ SOLN
INTRAMUSCULAR | Status: AC
  Filled 2022-02-05: qty 1

## 2022-02-05 MED ORDER — KETAMINE HCL 10 MG/ML IJ SOLN
INTRAMUSCULAR | Status: AC
  Filled 2022-02-05: qty 1

## 2022-02-05 MED ORDER — ROCURONIUM BROMIDE 10 MG/ML (PF) SYRINGE
PREFILLED_SYRINGE | INTRAVENOUS | Status: DC | PRN
  Administered 2022-02-05: 20 mg via INTRAVENOUS
  Administered 2022-02-05: 60 mg via INTRAVENOUS

## 2022-02-05 MED ORDER — DEXAMETHASONE SODIUM PHOSPHATE 10 MG/ML IJ SOLN
INTRAMUSCULAR | Status: DC | PRN
  Administered 2022-02-05: 5 mg via INTRAVENOUS

## 2022-02-05 MED ORDER — LACTATED RINGERS IR SOLN
Status: DC | PRN
  Administered 2022-02-05: 1000 mL

## 2022-02-05 MED ORDER — ORAL CARE MOUTH RINSE: 15.0000 mL | Freq: Once | OROMUCOSAL | Status: AC

## 2022-02-05 MED ORDER — APREPITANT 40 MG PO CAPS
40.0000 mg | ORAL_CAPSULE | Freq: Once | ORAL | Status: AC
  Administered 2022-02-05: 40 mg via ORAL

## 2022-02-05 MED ORDER — APREPITANT 40 MG PO CAPS
ORAL_CAPSULE | ORAL | Status: AC
  Filled 2022-02-05: qty 1

## 2022-02-05 MED ORDER — PHENYLEPHRINE 80 MCG/ML (10ML) SYRINGE FOR IV PUSH (FOR BLOOD PRESSURE SUPPORT)
PREFILLED_SYRINGE | INTRAVENOUS | Status: DC | PRN
  Administered 2022-02-05: 80 ug via INTRAVENOUS

## 2022-02-05 MED ORDER — MIDAZOLAM HCL 2 MG/2ML IJ SOLN
INTRAMUSCULAR | Status: AC
  Filled 2022-02-05: qty 2

## 2022-02-05 MED ORDER — MIDAZOLAM HCL 5 MG/5ML IJ SOLN
INTRAMUSCULAR | Status: DC | PRN
  Administered 2022-02-05: 2 mg via INTRAVENOUS

## 2022-02-05 MED ORDER — LIDOCAINE 20MG/ML (2%) 15 ML SYRINGE OPTIME
INTRAMUSCULAR | Status: DC | PRN
  Administered 2022-02-05: 1.5 mg/kg/h via INTRAVENOUS

## 2022-02-05 MED ORDER — ACETAMINOPHEN 325 MG PO TABS
650.0000 mg | ORAL_TABLET | Freq: Four times a day (QID) | ORAL | Status: DC
  Administered 2022-02-05 – 2022-02-07 (×7): 650 mg via ORAL
  Filled 2022-02-05 (×8): qty 2

## 2022-02-05 MED ORDER — OXYCODONE HCL 5 MG PO TABS: 5.0000 mg | ORAL_TABLET | Freq: Four times a day (QID) | ORAL | 0 refills | Status: DC | PRN

## 2022-02-05 MED ORDER — FENTANYL CITRATE (PF) 250 MCG/5ML IJ SOLN
INTRAMUSCULAR | Status: DC | PRN
Start: 2022-02-05 — End: 2022-02-05
  Administered 2022-02-05 (×2): 50 ug via INTRAVENOUS
  Administered 2022-02-05: 100 ug via INTRAVENOUS

## 2022-02-05 MED ORDER — ROCURONIUM BROMIDE 10 MG/ML (PF) SYRINGE
PREFILLED_SYRINGE | INTRAVENOUS | Status: AC
  Filled 2022-02-05: qty 10

## 2022-02-05 MED ORDER — LIDOCAINE HCL (PF) 2 % IJ SOLN
INTRAMUSCULAR | Status: AC
  Filled 2022-02-05: qty 5

## 2022-02-05 MED ORDER — PROPOFOL 500 MG/50ML IV EMUL
INTRAVENOUS | Status: AC
  Filled 2022-02-05: qty 50

## 2022-02-05 MED ORDER — OXYCODONE HCL 5 MG/5ML PO SOLN: 5.0000 mg | Freq: Once | ORAL | Status: DC | PRN

## 2022-02-05 MED ORDER — HYDROMORPHONE HCL 1 MG/ML IJ SOLN
0.2500 mg | INTRAMUSCULAR | Status: DC | PRN
  Administered 2022-02-05: 0.5 mg via INTRAVENOUS

## 2022-02-05 MED ORDER — PROPOFOL 10 MG/ML IV BOLUS
INTRAVENOUS | Status: DC | PRN
  Administered 2022-02-05: 140 mg via INTRAVENOUS

## 2022-02-05 MED ORDER — ONDANSETRON HCL 4 MG/2ML IJ SOLN
INTRAMUSCULAR | Status: AC
Start: 2022-02-05 — End: ?
  Filled 2022-02-05: qty 2

## 2022-02-05 MED ORDER — NAPHAZOLINE-GLYCERIN 0.012-0.25 % OP SOLN
1.0000 [drp] | Freq: Four times a day (QID) | OPHTHALMIC | Status: DC | PRN
  Administered 2022-02-06 (×2): 2 [drp] via OPHTHALMIC
  Filled 2022-02-05: qty 15

## 2022-02-05 MED ORDER — SCOPOLAMINE 1 MG/3DAYS TD PT72
MEDICATED_PATCH | TRANSDERMAL | Status: AC
  Filled 2022-02-05: qty 1

## 2022-02-05 MED ORDER — OXYCODONE HCL 5 MG PO TABS
5.0000 mg | ORAL_TABLET | ORAL | Status: DC | PRN
  Administered 2022-02-05 – 2022-02-07 (×7): 5 mg via ORAL
  Filled 2022-02-05 (×7): qty 1

## 2022-02-05 MED ORDER — BUPIVACAINE LIPOSOME 1.3 % IJ SUSP
INTRAMUSCULAR | Status: AC
  Filled 2022-02-05: qty 20

## 2022-02-05 MED ORDER — SIMETHICONE 80 MG PO CHEW: 40.0000 mg | CHEWABLE_TABLET | Freq: Four times a day (QID) | ORAL | Status: DC | PRN

## 2022-02-05 MED ORDER — EPHEDRINE 5 MG/ML INJ
INTRAVENOUS | Status: AC
  Filled 2022-02-05: qty 5

## 2022-02-05 MED ORDER — SUGAMMADEX SODIUM 200 MG/2ML IV SOLN
INTRAVENOUS | Status: DC | PRN
  Administered 2022-02-05: 200 mg via INTRAVENOUS

## 2022-02-05 MED ORDER — POLYETHYLENE GLYCOL 3350 17 GM/SCOOP PO POWD: 1.0000 | Freq: Once | ORAL | Status: DC

## 2022-02-05 MED ORDER — HYDROMORPHONE HCL 1 MG/ML IJ SOLN
INTRAMUSCULAR | Status: AC
  Filled 2022-02-05: qty 1

## 2022-02-05 MED ORDER — ACETAMINOPHEN 10 MG/ML IV SOLN
INTRAVENOUS | Status: AC
  Filled 2022-02-05: qty 100

## 2022-02-05 MED ORDER — ONDANSETRON 4 MG PO TBDP: 4.0000 mg | ORAL_TABLET | Freq: Four times a day (QID) | ORAL | Status: DC | PRN

## 2022-02-05 MED ORDER — SODIUM CHLORIDE (PF) 0.9 % IJ SOLN
INTRAMUSCULAR | Status: AC
  Filled 2022-02-05: qty 20

## 2022-02-05 MED ORDER — ACETAMINOPHEN 10 MG/ML IV SOLN
INTRAVENOUS | Status: DC | PRN
  Administered 2022-02-05: 1000 mg via INTRAVENOUS

## 2022-02-05 MED ORDER — ONDANSETRON HCL 4 MG/2ML IJ SOLN: 4.0000 mg | Freq: Four times a day (QID) | INTRAMUSCULAR | Status: DC | PRN

## 2022-02-05 MED ORDER — BUPIVACAINE LIPOSOME 1.3 % IJ SUSP
INTRAMUSCULAR | Status: DC | PRN
  Administered 2022-02-05: 20 mL

## 2022-02-05 MED ORDER — CHLORHEXIDINE GLUCONATE 0.12 % MT SOLN
15.0000 mL | Freq: Once | OROMUCOSAL | Status: AC
  Administered 2022-02-05: 15 mL via OROMUCOSAL

## 2022-02-05 SURGICAL SUPPLY — 65 items
BAG COUNTER SPONGE SURGICOUNT (BAG) ×2 IMPLANT
BAG LAPAROSCOPIC 12 15 PORT 16 (BASKET) ×2 IMPLANT
BAG RETRIEVAL 12/15 (BASKET) ×2
CHLORAPREP W/TINT 26 (MISCELLANEOUS) ×2 IMPLANT
CLIP LIGATING HEM O LOK PURPLE (MISCELLANEOUS) ×2 IMPLANT
CLIP LIGATING HEMO LOK XL GOLD (MISCELLANEOUS) ×2 IMPLANT
CLIP LIGATING HEMO O LOK GREEN (MISCELLANEOUS) ×2 IMPLANT
COVER SURGICAL LIGHT HANDLE (MISCELLANEOUS) ×2 IMPLANT
COVER TIP SHEARS 8 DVNC (MISCELLANEOUS) ×2 IMPLANT
COVER TIP SHEARS 8MM DA VINCI (MISCELLANEOUS) ×2
CUTTER ECHEON FLEX ENDO 45 340 (ENDOMECHANICALS) IMPLANT
DERMABOND IMPLANT
DERMABOND ADVANCED (GAUZE/BANDAGES/DRESSINGS) ×4
DERMABOND ADVANCED .7 DNX12 (GAUZE/BANDAGES/DRESSINGS) ×4 IMPLANT
DRAIN CHANNEL 15F RND FF 3/16 (WOUND CARE) IMPLANT
DRAPE ARM DVNC X/XI (DISPOSABLE) ×8 IMPLANT
DRAPE COLUMN DVNC XI (DISPOSABLE) ×2 IMPLANT
DRAPE DA VINCI XI ARM (DISPOSABLE) ×8
DRAPE DA VINCI XI COLUMN (DISPOSABLE) ×2
DRAPE INCISE IOBAN 66X45 STRL (DRAPES) ×2 IMPLANT
DRAPE SHEET LG 3/4 BI-LAMINATE (DRAPES) ×2 IMPLANT
ELECT PENCIL ROCKER SW 15FT (MISCELLANEOUS) ×2 IMPLANT
ELECT REM PT RETURN 15FT ADLT (MISCELLANEOUS) ×2 IMPLANT
EVACUATOR SILICONE 100CC (DRAIN) IMPLANT
GLOVE BIO SURGEON STRL SZ 6.5 (GLOVE) ×2 IMPLANT
GLOVE SURG LX 7.5 STRW (GLOVE) ×4
GLOVE SURG LX STRL 7.5 STRW (GLOVE) ×4 IMPLANT
GOWN SRG XL LVL 4 BRTHBL STRL (GOWNS) ×2 IMPLANT
GOWN STRL NON-REIN XL LVL4 (GOWNS) ×2
GOWN STRL REUS W/ TWL XL LVL3 (GOWN DISPOSABLE) ×4 IMPLANT
GOWN STRL REUS W/TWL XL LVL3 (GOWN DISPOSABLE) ×4
HOLDER FOLEY CATH W/STRAP (MISCELLANEOUS) ×2 IMPLANT
IRRIG SUCT STRYKERFLOW 2 WTIP (MISCELLANEOUS) ×2
IRRIGATION SUCT STRKRFLW 2 WTP (MISCELLANEOUS) ×2 IMPLANT
KIT BASIN OR (CUSTOM PROCEDURE TRAY) ×2 IMPLANT
KIT TURNOVER KIT A (KITS) IMPLANT
LOOP VESSEL MAXI BLUE (MISCELLANEOUS) IMPLANT
NDL INSUFFLATION 14GA 120MM (NEEDLE) ×2 IMPLANT
NEEDLE INSUFFLATION 14GA 120MM (NEEDLE) ×2 IMPLANT
PORT ACCESS TROCAR AIRSEAL 12 (TROCAR) ×2 IMPLANT
PORT ACCESS TROCAR AIRSEAL 5M (TROCAR) ×2
PROTECTOR NERVE ULNAR (MISCELLANEOUS) ×4 IMPLANT
RELOAD STAPLE 45 2.6 WHT THIN (STAPLE) IMPLANT
SEAL CANN UNIV 5-8 DVNC XI (MISCELLANEOUS) ×8 IMPLANT
SEAL XI 5MM-8MM UNIVERSAL (MISCELLANEOUS) ×8
SET TRI-LUMEN FLTR TB AIRSEAL (TUBING) ×2 IMPLANT
SOLUTION ELECTROLUBE (MISCELLANEOUS) ×2 IMPLANT
SPIKE FLUID TRANSFER (MISCELLANEOUS) ×2 IMPLANT
SPONGE T-LAP 4X18 ~~LOC~~+RFID (SPONGE) ×2 IMPLANT
STAPLE RELOAD 45 WHT (STAPLE) ×4 IMPLANT
STAPLE RELOAD 45MM WHITE (STAPLE) ×4
SUT ETHILON 3 0 PS 1 (SUTURE) IMPLANT
SUT MNCRL AB 4-0 PS2 18 (SUTURE) ×4 IMPLANT
SUT PDS AB 1 CT1 27 (SUTURE) ×6 IMPLANT
SUT VIC AB 3-0 SH 27 (SUTURE) ×2
SUT VIC AB 3-0 SH 27XBRD (SUTURE) IMPLANT
SUT VICRYL 0 UR6 27IN ABS (SUTURE) IMPLANT
TOWEL OR 17X26 10 PK STRL BLUE (TOWEL DISPOSABLE) ×2 IMPLANT
TOWEL OR NON WOVEN STRL DISP B (DISPOSABLE) ×2 IMPLANT
TRAY FOLEY MTR SLVR 16FR STAT (SET/KITS/TRAYS/PACK) ×2 IMPLANT
TRAY LAPAROSCOPIC (CUSTOM PROCEDURE TRAY) ×2 IMPLANT
TROCAR ADV FIXATION 12X100MM (TROCAR) ×2 IMPLANT
TROCAR Z THREAD OPTICAL 12X100 (TROCAR) ×2 IMPLANT
TROCAR Z-THREAD OPTICAL 5X100M (TROCAR) IMPLANT
WATER STERILE IRR 1000ML POUR (IV SOLUTION) ×2 IMPLANT

## 2022-02-05 NOTE — Anesthesia Procedure Notes (Signed)
Procedure Name: Intubation Date/Time: 02/05/2022 12:50 PM  Performed by: Talbot Grumbling, CRNAPre-anesthesia Checklist: Patient identified, Emergency Drugs available, Suction available and Patient being monitored Patient Re-evaluated:Patient Re-evaluated prior to induction Oxygen Delivery Method: Circle system utilized Preoxygenation: Pre-oxygenation with 100% oxygen Induction Type: IV induction Ventilation: Mask ventilation without difficulty Laryngoscope Size: Mac and 3 Grade View: Grade I Tube type: Oral Tube size: 7.0 mm Number of attempts: 1 Airway Equipment and Method: Stylet Placement Confirmation: ETT inserted through vocal cords under direct vision, positive ETCO2 and breath sounds checked- equal and bilateral Secured at: 21 cm Tube secured with: Tape Dental Injury: Teeth and Oropharynx as per pre-operative assessment

## 2022-02-05 NOTE — Plan of Care (Signed)
  Problem: Clinical Measurements: Goal: Ability to maintain clinical measurements within normal limits will improve Outcome: Progressing   Problem: Education: Goal: Knowledge of the prescribed therapeutic regimen will improve Outcome: Progressing   Problem: Bowel/Gastric: Goal: Gastrointestinal status for postoperative course will improve Outcome: Progressing   Problem: Urinary Elimination: Goal: Ability to avoid or minimize complications of infection will improve Outcome: Progressing

## 2022-02-05 NOTE — Discharge Instructions (Addendum)
1- Drain Sites - You may have some mild persistent drainage from old drain site for several days, this is normal. This can be covered with cotton gauze for convenience.  2 - Stiches - Your stitches are all dissolvable. You may notice a "loose thread" at your incisions, these are normal and require no intervention. You may cut them flush to the skin with fingernail clippers if needed for comfort.  3 - Diet - No restrictions  4 - Activity - No heavy lifting / straining (any activities that require valsalva or "bearing down") x 4 weeks. Otherwise, no restrictions.  5 - Bathing - You may shower immediately. Do not take a bath or get into swimming pool where incision sites are submersed in water x 4 weeks.   6 - Catheter - Will remain in place until removed at your next appointment. It may be cleaned with soap and water in the shower. It may be disconnected from the drain bag while in the shower to avoid tripping over the tube. You may apply Neosporin or Vaseline ointment as needed to the tip of the penis where the catheter inserts to reduce friction and irritation in this spot.   7 - Pathology Results - pathology results will be posted to MyChart as soon as they are available (usually 3-4 business days). They will be discussed in detail at the post-op visit.   8 - When to Call the Doctor - Call MD for any fever >102, any acute wound problems, or any severe nausea / vomiting. You can call the [facility] [phone]) 24 hours a day 365 days a year. It will roll-over to the answering service and on-call physician after hours.   You may resume aspirin, advil, aleve, vitamins, and supplements 7 days after surgery.

## 2022-02-05 NOTE — H&P (Signed)
Mary Harrell is an 71 y.o. female.    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 Mass is solitary, abuts renal vein and renal sinus fat (?early T3). 1 artery / 1 vein (retroaortic) left renovascular anatomy. Some shoddy <2cm periaortic nodes near kidney as well. No additional lesions. No h/o renal dysfunction.   2 - Urolithiasis - episode colic 2992, Ca Ox per report. No colic in years since. Stone free on imagign 2023.   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 Her PCP is [name] MD with [facility].   Today "Collie Siad" is seen to proceed with LEFT robotic radical nephrectomy for hilar mass. No interval fevers.    Past Medical History:  Diagnosis Date   Anemia    Anxiety    Arthritis    Complication of anesthesia    Pt stated it takes like 6 months to get anesthesia out of her system   Depression    GERD (gastroesophageal reflux disease)    Headache    migraines   History of kidney stones    Hyperlipemia    Hypoglycemia    Mitral valve prolapse    PONV (postoperative nausea and vomiting)    Skin cancer    Squamous cell removed    Past Surgical History:  Procedure Laterality Date   ANTERIOR CERVICAL DECOMP/DISCECTOMY FUSION N/A 06/05/2014   Procedure: CERVICAL THREE-FOUR, CERVICAL FOUR-FIVE, CERVICAL FIVE-SIX ANTERIOR CERVICAL DECOMPRESSION/DISCECTOMY FUSION 3 LEVELS;  Surgeon: Elaina Hoops, MD;  Location: [facility];  Service: Neurosurgery;  Laterality: N/A;   BELPHAROPTOSIS REPAIR Bilateral    BREAST REDUCTION SURGERY  06/09/1974   BUNIONECTOMY Bilateral    CARPAL TUNNEL RELEASE Bilateral    SKIN TAG REMOVAL     SQUAMOUS CELL CARCINOMA EXCISION  06/09/2009   removed from chest   TOE FUSION Right 03/23/2020    History reviewed. No pertinent family  history. Social History:  reports that she has never smoked. She has never used smokeless tobacco. She reports current alcohol use. She reports that she does not use drugs.  Allergies:  Allergies  Allergen Reactions   Morphine Nausea And Vomiting   Tramadol Nausea And Vomiting and Other (See Comments)    Inability to urinate for many hours   Aspirin Tinitus   Atovaquone Diarrhea   Celecoxib Other (See Comments)     headache   Esomeprazole Magnesium Other (See Comments)    Headache   Gabapentin Other (See Comments)    Headache   Guaifenesin-Codeine Other (See Comments)    GI Upset (intolerance)   Hydrocodone-Acetaminophen Nausea And Vomiting   Malarone [Atovaquone-Proguanil Hcl] Other (See Comments)    GI Upset (intolerance)   Meloxicam Itching    Pt stated, "If I take 15 mg, it upsets stomach, but can take 7.5 mg okay" Swollen Hands   Nitrofurantoin Other (See Comments)    Other reaction(s): muscle tightness in calves,   Oxycodone Hcl Nausea And Vomiting   Oxycodone-Acetaminophen Other (See Comments)     GI Upset (intolerance)   Proguanil Diarrhea   Sulfamethoxazole-Trimethoprim Other (See Comments)    Leg pain   Ciprofloxacin Other (See Comments)    Causes tightness in calf muscles    Medications Prior to Admission  Medication Sig Dispense Refill   carboxymethylcellulose (REFRESH PLUS)  0.5 % SOLN Place 1 drop into both ears daily as needed (Dry eye).     cholecalciferol (VITAMIN D3) 25 MCG (1000 UNIT) tablet Take 1,000 Units by mouth daily.     cyanocobalamin (VITAMIN B12) 1000 MCG tablet Take 1,000 mcg by mouth daily.     diclofenac (VOLTAREN) 75 MG EC tablet Take 75 mg by mouth daily as needed for mild pain or moderate pain.     diclofenac Sodium (VOLTAREN) 1 % GEL Apply 2 g topically daily as needed (Pain). (Patient not taking: Reported on 01/30/2022)     diphenhydrAMINE (SOMINEX) 25 MG tablet Take 25 mg by mouth at bedtime as needed for sleep.     doxylamine, Sleep,  (UNISOM) 25 MG tablet Take 25 mg by mouth at bedtime as needed for sleep. Mini     estradiol (ESTRACE) 0.1 MG/GM vaginal cream Place 1 Applicatorful vaginally 2 (two) times a week.     folic acid (FOLVITE) 1 MG tablet Take 1 mg by mouth daily.     ibuprofen (MOTRIN IB) 200 MG tablet Take 200 mg by mouth every 6 (six) hours as needed for moderate pain or mild pain.     Menthol, Topical Analgesic, (BIOFREEZE EX) Apply 1 Application topically daily as needed (Pain).     methotrexate (RHEUMATREX) 2.5 MG tablet Take 15 mg by mouth once a week.     Multiple Vitamin (MULTIVITAMIN) capsule Take 1 capsule by mouth daily. centrum     naproxen sodium (ALEVE) 220 MG tablet Take 220 mg by mouth daily as needed (Pain).     ondansetron (ZOFRAN-ODT) 4 MG disintegrating tablet Take 4 mg by mouth every 8 (eight) hours as needed for nausea.     oxyCODONE (OXY IR/ROXICODONE) 5 MG immediate release tablet Take 5 mg by mouth every 6 (six) hours as needed for pain.     polyethylene glycol (MIRALAX / GLYCOLAX) 17 g packet Take 17 g by mouth daily as needed for mild constipation or moderate constipation.     psyllium (METAMUCIL) 58.6 % packet Take 1 packet by mouth daily as needed (Constipation).     sertraline (ZOLOFT) 25 MG tablet Take 12.5 mg by mouth daily.     simvastatin (ZOCOR) 20 MG tablet Take 20 mg by mouth daily.  1   SUMAtriptan (IMITREX) 50 MG tablet Take 50 mg by mouth as needed for migraine.  1   COVID-19 mRNA bivalent vaccine, Pfizer, (PFIZER COVID-19 VAC BIVALENT) injection Inject into the muscle. 0.3 mL 0   COVID-19 mRNA Vac-TriS, Pfizer, (PFIZER-BIONT COVID-19 VAC-TRIS) SUSP injection Inject into the muscle. 0.3 mL 0   ibuprofen (ADVIL) 600 MG tablet Take 600 mg by mouth every 6 (six) hours as needed for pain.      No results found for this or any previous visit (from the past 48 hour(s)). No results found.  Review of Systems  Constitutional:  Negative for chills and fever.  All other systems  reviewed and are negative.   Blood pressure (!) 148/76, pulse 73, temperature 97.9 F (36.6 C), temperature source Oral, resp. rate 15, height '5\' 3"'$  (1.6 m), weight 61.7 kg, SpO2 100 %. Physical Exam Vitals reviewed.  HENT:     Head: Normocephalic.  Eyes:     Pupils: Pupils are equal, round, and reactive to light.  Cardiovascular:     Rate and Rhythm: Normal rate.  Pulmonary:     Effort: Pulmonary effort is normal.  Abdominal:     General: Abdomen is flat.  Genitourinary:    Comments: No CVAT Musculoskeletal:        General: Normal range of motion.     Cervical back: Normal range of motion.  Neurological:     General: No focal deficit present.     Mental Status: She is alert.      Assessment/Plan  Proceed as planned with LEFT robotic partial nephrectomy. Risks, benefits, alternatives, expected peri-op course discussed previously and reiterated today.   Alexis Frock, MD 02/05/2022, 10:41 AM

## 2022-02-05 NOTE — Brief Op Note (Signed)
02/05/2022  2:56 PM  PATIENT:  Mary Harrell  71 y.o. female  PRE-OPERATIVE DIAGNOSIS:  LEFT RENAL MASS  POST-OPERATIVE DIAGNOSIS:  LEFT RENAL MASS  PROCEDURE:  Procedure(s) with comments: XI ROBOTIC ASSISTED LAPAROSCOPIC RADICAL NEPHRECTOMY (Left) - 3 HRS LEFT RETROPERITONEAL LYMPH NODE DISSECTION (Left)  SURGEON:  Surgeon(s) and Role:    Alexis Frock, MD - Primary  PHYSICIAN ASSISTANT:   ASSISTANTS: Debbrah Alar PA   ANESTHESIA:   local and general  EBL:  50 mL   BLOOD ADMINISTERED:none  DRAINS:  foley to gravity    LOCAL MEDICATIONS USED:  MARCAINE     SPECIMEN:  Source of Specimen:  left radical nephrectomy, peri-aortic lymph nodes  DISPOSITION OF SPECIMEN:  PATHOLOGY  COUNTS:  YES  TOURNIQUET:  * No tourniquets in log *  DICTATION: .Other Dictation: Dictation Number [id number]  PLAN OF CARE: Admit to inpatient   PATIENT DISPOSITION:  PACU - hemodynamically stable.   Delay start of Pharmacological VTE agent (>24hrs) due to surgical blood loss or risk of bleeding: yes

## 2022-02-05 NOTE — Anesthesia Postprocedure Evaluation (Signed)
Anesthesia Post Note  Patient: Mary Harrell  Procedure(s) Performed: XI ROBOTIC ASSISTED LAPAROSCOPIC RADICAL NEPHRECTOMY (Left: Renal) LEFT RETROPERITONEAL LYMPH NODE DISSECTION (Left: Abdomen)     Patient location during evaluation: PACU Anesthesia Type: General Level of consciousness: awake and alert Pain management: pain level controlled Vital Signs Assessment: post-procedure vital signs reviewed and stable Respiratory status: spontaneous breathing, nonlabored ventilation, respiratory function stable and patient connected to nasal cannula oxygen Cardiovascular status: blood pressure returned to baseline and stable Postop Assessment: no apparent nausea or vomiting Anesthetic complications: no   No notable events documented.  Last Vitals:  Vitals:   02/05/22 1545 02/05/22 1600  BP: 130/76 134/79  Pulse: 79 77  Resp: 13 12  Temp:    SpO2: 98% 99%    Last Pain:  Vitals:   02/05/22 1619  TempSrc:   PainSc: 8                  Barnet Glasgow

## 2022-02-05 NOTE — Transfer of Care (Signed)
Immediate Anesthesia Transfer of Care Note  Patient: Mary Harrell  Procedure(s) Performed: XI ROBOTIC ASSISTED LAPAROSCOPIC RADICAL NEPHRECTOMY (Left: Renal) LEFT RETROPERITONEAL LYMPH NODE DISSECTION (Left: Abdomen)  Patient Location: PACU  Anesthesia Type:General  Level of Consciousness: awake, alert  and oriented  Airway & Oxygen Therapy: Patient Spontanous Breathing and Patient connected to face mask oxygen  Post-op Assessment: Report given to RN and Post -op Vital signs reviewed and stable  Post vital signs: Reviewed and stable  Last Vitals:  Vitals Value Taken Time  BP 136/66 02/05/22 1517  Temp    Pulse 82 02/05/22 1520  Resp 19 02/05/22 1520  SpO2 100 % 02/05/22 1520  Vitals shown include unvalidated device data.  Last Pain:  Vitals:   02/05/22 1040  TempSrc: Oral         Complications: No notable events documented.

## 2022-02-06 ENCOUNTER — Encounter (HOSPITAL_COMMUNITY): Payer: Self-pay | Admitting: Urology

## 2022-02-06 LAB — HEMOGLOBIN AND HEMATOCRIT, BLOOD
HCT: 37.1 % (ref 36.0–46.0)
Hemoglobin: 12.4 g/dL (ref 12.0–15.0)

## 2022-02-06 LAB — BASIC METABOLIC PANEL
Anion gap: 7 (ref 5–15)
BUN: 20 mg/dL (ref 8–23)
CO2: 23 mmol/L (ref 22–32)
Calcium: 8.6 mg/dL — ABNORMAL LOW (ref 8.9–10.3)
Chloride: 106 mmol/L (ref 98–111)
Creatinine, Ser: 1.34 mg/dL — ABNORMAL HIGH (ref 0.44–1.00)
GFR, Estimated: 42 mL/min — ABNORMAL LOW (ref >60)
Glucose, Bld: 166 mg/dL — ABNORMAL HIGH (ref 70–99)
Potassium: 4.8 mmol/L (ref 3.5–5.1)
Sodium: 136 mmol/L (ref 135–145)

## 2022-02-06 LAB — GLUCOSE, CAPILLARY: Glucose-Capillary: 91 mg/dL (ref 70–99)

## 2022-02-06 MED ORDER — POLYVINYL ALCOHOL 1.4 % OP SOLN
1.0000 [drp] | OPHTHALMIC | Status: DC | PRN
  Administered 2022-02-06 (×2): 1 [drp] via OPHTHALMIC
  Filled 2022-02-06: qty 15

## 2022-02-06 MED ORDER — MENTHOL 3 MG MT LOZG
1.0000 | LOZENGE | OROMUCOSAL | Status: DC | PRN
  Administered 2022-02-06: 3 mg via ORAL
  Filled 2022-02-06: qty 9

## 2022-02-06 MED ORDER — DIPHENHYDRAMINE HCL 25 MG PO CAPS
25.0000 mg | ORAL_CAPSULE | Freq: Four times a day (QID) | ORAL | Status: DC | PRN
Start: 2022-02-06 — End: 2022-02-07
  Administered 2022-02-06: 25 mg via ORAL
  Filled 2022-02-06: qty 1

## 2022-02-06 NOTE — Progress Notes (Signed)
  Transition of Care [facility]) Screening Note   Patient Details  Name: Mary Harrell Date of Birth: 18-Nov-1950   [facility]) CM/SW Contact:    Dessa Phi, RN Phone Number: 02/06/2022, 2:04 PM    Transition of Care Department [facility]) has reviewed patient and no TOC needs have been identified at this time. We will continue to monitor patient advancement through interdisciplinary progression rounds. If new patient transition needs arise, please place a TOC consult.

## 2022-02-06 NOTE — Progress Notes (Signed)
Mobility Specialist - Progress Note     02/06/22 1444  Mobility  Activity Ambulated with assistance in hallway  Level of Assistance Contact guard assist, steadying assist  Assistive Device Front wheel walker  Distance Ambulated (ft) 250 ft  Activity Response Tolerated well  $Mobility charge 1 Mobility   Pt was found in bed and agreeable to mobilize. Pt stated having 4/10 abdominal pain while ambulating. At EOS returned to recliner chair with all necessities in reach and nursing students in room.  Ferd Hibbs Mobility Specialist

## 2022-02-06 NOTE — Progress Notes (Signed)
1 Day Post-Op   Subjective/Chief Complaint:  1 - Left Renal Mass - s/p LEFT radical nephrectomy with retroperitoneal node dissection 02/05/2022, day of admission, without acute complications. Path pending. Admitted to 4th floor Urology service post-op.   Today "Mary Harrell" is progressing well. Ambulated, tollerating clears, pain managable.    Objective: Vital signs in last 24 hours: Temp:  [97.3 F (36.3 C)-98.8 F (37.1 C)] 98 F (36.7 C) (08/31 0810) Pulse Rate:  [75-95] 75 (08/31 0810) Resp:  [10-18] 17 (08/31 0810) BP: (109-146)/(64-80) 114/70 (08/31 0950) SpO2:  [98 %-100 %] 100 % (08/31 0810) Last BM Date : 02/05/22 (per pt completed a clean out with miralax and gatorade)  Intake/Output from previous day: 08/30 0701 - 08/31 0700 In: 2244 [P.O.:240; I.V.:1904; IV Piggyback:100] Out: 850 [Urine:800; Blood:50] Intake/Output this shift: Total I/O In: -  Out: 40 [Urine:40]  NAD, AOx3 Non-labored breathing on minimal NCO2 RRR SNTND, recent port/extraction sites c/d/I with mild bruising as expected [place] place with non-foul urine SCD's in place  Lab Results:  Recent Labs    02/06/22 0447  HGB 12.4  HCT 37.1   BMET Recent Labs    02/06/22 0447  NA 136  K 4.8  CL 106  CO2 23  GLUCOSE 166*  BUN 20  CREATININE 1.34*  CALCIUM 8.6*   PT/INR No results for input(s): "LABPROT", "INR" in the last 72 hours. ABG No results for input(s): "PHART", "HCO3" in the last 72 hours.  Invalid input(s): "PCO2", "PO2"  Studies/Results: No results found.  Anti-infectives: Anti-infectives (From admission, onward)    Start     Dose/Rate Route Frequency Ordered Stop   02/05/22 1037  ceFAZolin (ANCEF) 2-4 GM/100ML-% IVPB       Note to Pharmacy: Randa Evens D: cabinet override      02/05/22 1037 02/05/22 1308   02/05/22 1024  ceFAZolin (ANCEF) IVPB 2g/100 mL premix        2 g 200 mL/hr over 30 Minutes Intravenous 30 min pre-op 02/05/22 1024 02/05/22 1306        Assessment/Plan:  Doing well POD 1. Goals for DC discussed, likely tomorrow based on current recovery. DC foley, SLIV, advance to reg diet.    Mary Harrell 02/06/2022

## 2022-02-06 NOTE — Plan of Care (Signed)
  Problem: Activity: Goal: Risk for activity intolerance will decrease Outcome: Progressing   Problem: Nutrition: Goal: Adequate nutrition will be maintained Outcome: Progressing   Problem: Education: Goal: Knowledge of General Education information will improve Description: Including pain rating scale, medication(s)/side effects and non-pharmacologic comfort measures Outcome: Completed/Met   Problem: Clinical Measurements: Goal: Respiratory complications will improve Outcome: Completed/Met   Problem: Coping: Goal: Level of anxiety will decrease Outcome: Completed/Met

## 2022-02-07 LAB — BASIC METABOLIC PANEL
Anion gap: 4 — ABNORMAL LOW (ref 5–15)
BUN: 20 mg/dL (ref 8–23)
CO2: 23 mmol/L (ref 22–32)
Calcium: 8.2 mg/dL — ABNORMAL LOW (ref 8.9–10.3)
Chloride: 109 mmol/L (ref 98–111)
Creatinine, Ser: 1.36 mg/dL — ABNORMAL HIGH (ref 0.44–1.00)
GFR, Estimated: 41 mL/min — ABNORMAL LOW (ref >60)
Glucose, Bld: 99 mg/dL (ref 70–99)
Potassium: 3.8 mmol/L (ref 3.5–5.1)
Sodium: 136 mmol/L (ref 135–145)

## 2022-02-07 LAB — HEMOGLOBIN AND HEMATOCRIT, BLOOD
HCT: 35.5 % — ABNORMAL LOW (ref 36.0–46.0)
Hemoglobin: 11.8 g/dL — ABNORMAL LOW (ref 12.0–15.0)

## 2022-02-07 LAB — SURGICAL PATHOLOGY

## 2022-02-07 NOTE — Op Note (Unsigned)
NAME: Mary Harrell, Mary Harrell MEDICAL RECORD NO: [id number] ACCOUNT NO: [id number] DATE OF BIRTH: Jun 09, 1951 FACILITY: [facility] LOCATION: [id number] PHYSICIAN: Alexis Frock, MD  Operative Report   DATE OF PROCEDURE: 02/05/2022  PREOPERATIVE DIAGNOSIS:  Left renal mass.  PROCEDURE: 1.  Robotic-assisted laparoscopic left radical nephrectomy. 2.  Retroperitoneal lymph node dissection.  ESTIMATED BLOOD LOSS:  50 mL  COMPLICATIONS:  None.  SPECIMENS:  1.  Left radical nephrectomy. 2.  Periaortic lymph nodes.  ASSISTANT:  Debbrah Alar, PA  FINDINGS: 1.  Single artery, single vein, retroaortic vein left renal vascular anatomy. 2.  Shotty periaortic lymph nodes in the hilar location.  INDICATIONS:  The patient is a pleasant and quite healthy 71 year old lady who was found incidentally on spine imaging to have an enhancing left renal mass.  She underwent dedicated abdominal, renal imaging, which corroborated enhancing heterogeneous  mass consistent with renal cell carcinoma.  The mass is not very large; however, it is in a position that is in the hilar location, in direct apposition to the renal vein and hilar fat.  There was also some borderline adenopathy in the retroaortic area.   Options were discussed for management including surveillance protocols versus ablative therapy versus surgical extirpation with nephron sparing, and we both agreed that radical nephrectomy with lymph node sampling would be most prudent.  Her  contralateral kidney is structurally normal, has excellent renal function. Agreed to proceed.  Informed consent was obtained and placed in medical record.  PROCEDURE IN DETAIL:  The patient being verified, procedure being left robotic radical nephrectomy with retroperitoneal lymph node dissection was confirmed.  Procedure timeout was performed.  Intravenous antibiotics were administered.  General  endotracheal anesthesia induced.  The patient was placed into the left side up full flank  position, pulling 15 degrees of table flexion, superior arm elevator, axillary roll, sequential compression devices, bottom leg bent, top leg straight.  She was  further fastened to operating table using 3-inch tape over foam padding across her supraxiphoid chest and her pelvis, after Foley catheter was placed per urethra to straight drain and a sterile field was created, prepped and draped the patient's entire  left flank and abdomen using chlorhexidine gluconate.  Notably, axillary roll was used as was superior arm elevator. A high flow, low pressure pneumoperitoneum was then obtained using Veress technique in the left lower quadrant, having passed the  aspiration and drop test.  An 8 mm robotic camera port was then placed in position approximately one handbreadth superolateral to the umbilicus.  Laparoscopic examination of the peritoneal cavity revealed no significant adhesions and no visceral injury.   Additional ports were placed as follows:  Left subcostal 8 mm robotic port, left far lateral 8 mm robotic port, approximately 4 fingerbreadths superior medial to the anterior superior iliac spine, left paramedian inferior robotic port, approximately 1  handbreadth superior to the pubic ramus and two 12 mm assistant port sites in the midline, one approximately 2 fingerbreadths superior to the plane of the camera port, one approximately 2 fingerbreadths inferior. Robot was docked and passed the  electronic checks.  Initial attention was directed at development of retroperitoneum.  Incision was made lateral to the descending colon from the area the splenic flexure towards the area of internal ring.  The colon was carefully swept medially.  The  lower pole of the kidney was identified, placed on gentle lateral traction.  Lateral splenic attachments were taken down to allow the spleen to auto-retract medially and the plane posterior to  the pancreas, anterior to Gerota's fascia was further  developed, allowing  the pancreas also to fall medially with the spleen in an auto-retraction fashion.  Ureter was identified, placed on gentle lateral traction.  Dissection proceeded within the triangle of the ureter and psoas musculature towards the  area of renal hilum.  Renal hilum consisted of single artery, single vein renovascular anatomy as anticipated.  The vein was retroaortic. Artery was circumferentially mobilized and controlled with extra large Hem-o-lok clip proximal, vascular stapler  distal and the vein with vascular stapler.  This resulted in excellent hemostatic control of the hilum. Superior medial attachments were carefully taken down using cautery scissors in a sequential fashion in a partial adrenal sparing fashion.  Superior  attachments were taken down with cautery scissors as were lateral attachments.  The ureter was doubly clipped and ligated. The gonadal vessels were triply clipped and ligated.  This completely freed up the left radical nephrectomy specimen, it was placed  in the EndoCatch bag for later retrieval.  The area of the hilum and aorta was dissected again. As anticipated, there was some borderline adenopathy, mostly in an area superior to the left renal artery.  This lymphatic tissue was very carefully  mobilized in a periaortic packet.  Good lymphostasis achieved with cold clips and visually at least 3 of the borderline lymph nodes were sampled and this was set aside labeled as periaortic lymph nodes.  Hemostasis was excellent.  Sponge and needle  counts were correct.  Lymphostasis also appeared excellent.  We achieved the goal of extirpated portion of the surgery today.  Robot was then undocked.  Specimen was retrieved by connecting the 2 previous assistant port sites in midline and removing the  radical nephrectomy specimen and setting aside for permanent pathology.  The extraction site was closed at the level of the fascia using figure-of-eight PDS x5 followed by reapproximation of  Scarpa's with running Vicryl.  All incision sites were  infiltrated with dilute lipolyzed Marcaine and closed at the level of the skin using subcuticular Monocryl followed by Dermabond.  Procedure was then terminated.  The patient tolerated the procedure well, no immediate periprocedural complications.  The  patient was taken to postanesthesia care in stable condition.  Plan for inpatient admission.  Please note, first assistant, Debbrah Alar, was crucial for all portions of the surgery today.  She provided invaluable retraction, suctioning, vascular clipping, vascular stapling, specimen manipulation, and general first assistance.   SHW D: 02/05/2022 3:03:31 pm T: 02/06/2022 12:17:00 am  JOB: [id number]/ [id number]

## 2022-02-07 NOTE — Discharge Summary (Signed)
Physician Discharge Summary  Patient ID: Mary Harrell MRN: [id number] DOB/AGE: [birth year] 71 y.o.  Admit date: 02/05/2022 Discharge date: 02/07/2022  Admission Diagnoses: Left Renal Mass  Discharge Diagnoses:  Principal Problem:   Renal mass, left   Discharged Condition: good  Hospital Course: Pt underwent LEFT robotic radical nephrectomy 02/05/22, the day of admission, without acute complication. Admitted to 4th floor urology service post-op where she began her vigorous recovery. By the AM of POD 2, the day of discharge, she is ambulatory, tollerating PO nutrition, pain controlled on PO meds, and felt to be adequate for discharge. Cr 1.3, Hgb 12, final path pending.   Consults: None  Significant Diagnostic Studies: labs: as per above  Treatments: surgery: as per above  Discharge Exam: Blood pressure 138/75, pulse 63, temperature 98 F (36.7 C), temperature source Oral, resp. rate 18, height '5\' 3"'$  (1.6 m), weight 61.7 kg, SpO2 99 %.  NAD, husband at bedside, both very pleasant Non-labored breathing on RA RRR SNTND, stable moderate truncal obesity. Recent port sites c/d/I. No c/c/e  Disposition: Discharge disposition: 01-Home or Self Care        Allergies as of 02/07/2022       Reactions   Morphine Nausea And Vomiting   Tramadol Nausea And Vomiting, Other (See Comments)   Inability to urinate for many hours   Aspirin Tinitus   Atovaquone Diarrhea   Celecoxib Other (See Comments)    headache   Esomeprazole Magnesium Other (See Comments)   Headache   Gabapentin Other (See Comments)   Headache   Guaifenesin-codeine Other (See Comments)   GI Upset (intolerance)   Hydrocodone-acetaminophen Nausea And Vomiting   Malarone [atovaquone-proguanil Hcl] Other (See Comments)   GI Upset (intolerance)   Meloxicam Itching   Pt stated, "If I take 15 mg, it upsets stomach, but can take 7.5 mg okay" Swollen Hands   Nitrofurantoin Other (See Comments)   Other reaction(s):  muscle tightness in calves,   Oxycodone Hcl Nausea And Vomiting   Oxycodone-acetaminophen Other (See Comments)    GI Upset (intolerance)   Proguanil Diarrhea   Sulfamethoxazole-trimethoprim Other (See Comments)   Leg pain   Ciprofloxacin Other (See Comments)   Causes tightness in calf muscles        Medication List     STOP taking these medications    cholecalciferol 25 MCG (1000 UNIT) tablet Commonly known as: VITAMIN D3   cyanocobalamin 1000 MCG tablet Commonly known as: VITAMIN E26   folic acid 1 MG tablet Commonly known as: FOLVITE   ibuprofen 600 MG tablet Commonly known as: ADVIL   Motrin IB 200 MG tablet Generic drug: ibuprofen   multivitamin capsule   naproxen sodium 220 MG tablet Commonly known as: ALEVE       TAKE these medications    BIOFREEZE EX Apply 1 Application topically daily as needed (Pain).   carboxymethylcellulose 0.5 % Soln Commonly known as: REFRESH PLUS Place 1 drop into both ears daily as needed (Dry eye).   diclofenac 75 MG EC tablet Commonly known as: VOLTAREN Take 75 mg by mouth daily as needed for mild pain or moderate pain.   diphenhydrAMINE 25 MG tablet Commonly known as: SOMINEX Take 25 mg by mouth at bedtime as needed for sleep.   doxylamine (Sleep) 25 MG tablet Commonly known as: UNISOM Take 25 mg by mouth at bedtime as needed for sleep. Mini   estradiol 0.1 MG/GM vaginal cream Commonly known as: ESTRACE Place 1 Applicatorful vaginally 2 (two)  times a week.   methotrexate 2.5 MG tablet Commonly known as: RHEUMATREX Take 15 mg by mouth once a week.   ondansetron 4 MG disintegrating tablet Commonly known as: ZOFRAN-ODT Take 4 mg by mouth every 8 (eight) hours as needed for nausea.   oxyCODONE 5 MG immediate release tablet Commonly known as: Oxy IR/ROXICODONE Take 1-2 tablets (5-10 mg total) by mouth every 6 (six) hours as needed for moderate pain or severe pain. What changed:  how much to take reasons to  take this   Pfizer COVID-19 Vac Bivalent injection Generic drug: COVID-19 mRNA bivalent vaccine (Pfizer) Inject into the muscle.   Pfizer-BioNT COVID-19 Vac-TriS Susp injection Generic drug: COVID-19 mRNA Vac-TriS (Pfizer) Inject into the muscle.   polyethylene glycol 17 g packet Commonly known as: MIRALAX / GLYCOLAX Take 17 g by mouth daily as needed for mild constipation or moderate constipation.   psyllium 58.6 % packet Commonly known as: METAMUCIL Take 1 packet by mouth daily as needed (Constipation).   sertraline 25 MG tablet Commonly known as: ZOLOFT Take 12.5 mg by mouth daily.   simvastatin 20 MG tablet Commonly known as: ZOCOR Take 20 mg by mouth daily.   SUMAtriptan 50 MG tablet Commonly known as: IMITREX Take 50 mg by mouth as needed for migraine.   Voltaren 1 % Gel Generic drug: diclofenac Sodium Apply 2 g topically daily as needed (Pain).        Follow-up Information     Alexis Frock, MD Follow up on 02/24/2022.   Specialty: Urology Why: at 2:30 PM for MD visit and pathology review Contact information: [place] [address] [phone]                 Signed: Alexis Frock 02/07/2022, 9:34 AM

## 2022-02-07 NOTE — Discharge Summary (Signed)
Date of admission: 02/05/2022  Date of discharge: 02/07/2022  Admission diagnosis: Left renal mass  Discharge diagnosis: Same  Secondary diagnoses: None  History and Physical: For full details, please see admission history and physical. Briefly, Mary Harrell is a 71 y.o. year old patient with history of  urolithiasis, rheumatoid arthritis on methotrexate and left 2.5cm left renal mass diagnosed earlier this year.    She presented to the operating room on 02/05/22 for elective robotic left radical nephrectomy. The case was uncomplicated and the patient tolerated it well. Once stable, she was transferred to the floor. Post-operatively, she did well. Her diet was advanced to a regular diet as tolerated, her foley catheter was removed on post-op day #1 and she passed the trial of void. Her pain was controlled with oral pain medication. By day of discharge, she had met all her milestones. She was discharged on 02/07/22. She will follow up for post-operative check on 02/24/22.  Laboratory values:  Recent Labs    02/06/22 0447 02/07/22 0521  HGB 12.4 11.8*  HCT 37.1 35.5*   Recent Labs    02/06/22 0447 02/07/22 0521  CREATININE 1.34* 1.36*   NAD, AOx3 Non-labored breathing on room air Regular rate Soft, non-tender abdomen, non-distended. Bruising noted around incision sites. Voiding spontaneously SCD's in place   Disposition: Home  Discharge instructions:  1- Drain Sites - You may have some mild persistent drainage from old drain site for several days, this is normal. This can be covered with cotton gauze for convenience.  2 - Stiches - Your stitches are all dissolvable. You may notice a "loose thread" at your incisions, these are normal and require no intervention. You may cut them flush to the skin with fingernail clippers if needed for comfort.  3 - Diet - No restrictions  4 - Activity - No heavy lifting / straining (any activities that require valsalva or "bearing down") x 4  weeks. Otherwise, no restrictions.  5 - Bathing - You may shower immediately. Do not take a bath or get into swimming pool where incision sites are submersed in water x 4 weeks.   6 - Catheter - Will remain in place until removed at your next appointment. It may be cleaned with soap and water in the shower. It may be disconnected from the drain bag while in the shower to avoid tripping over the tube. You may apply Neosporin or Vaseline ointment as needed to the tip of the penis where the catheter inserts to reduce friction and irritation in this spot.   7 - Pathology Results - pathology results will be posted to MyChart as soon as they are available (usually 3-4 business days). They will be discussed in detail at the post-op visit.   8 - When to Call the Doctor - Call MD for any fever >102, any acute wound problems, or any severe nausea / vomiting. You can call the [facility] [phone] 24 hours a day 365 days a year. It will roll-over to the answering service and on-call physician after hours.   You may resume aspirin, advil, aleve, vitamins, and supplements 7 days after surgery.  Discharge medications:  Allergies as of 02/07/2022       Reactions   Morphine Nausea And Vomiting   Tramadol Nausea And Vomiting, Other (See Comments)   Inability to urinate for many hours   Aspirin Tinitus   Atovaquone Diarrhea   Celecoxib Other (See Comments)    headache   Esomeprazole Magnesium Other (  See Comments)   Headache   Gabapentin Other (See Comments)   Headache   Guaifenesin-codeine Other (See Comments)   GI Upset (intolerance)   Hydrocodone-acetaminophen Nausea And Vomiting   Malarone [atovaquone-proguanil Hcl] Other (See Comments)   GI Upset (intolerance)   Meloxicam Itching   Pt stated, "If I take 15 mg, it upsets stomach, but can take 7.5 mg okay" Swollen Hands   Nitrofurantoin Other (See Comments)   Other reaction(s): muscle tightness in calves,   Oxycodone Hcl Nausea  And Vomiting   Oxycodone-acetaminophen Other (See Comments)    GI Upset (intolerance)   Proguanil Diarrhea   Sulfamethoxazole-trimethoprim Other (See Comments)   Leg pain   Ciprofloxacin Other (See Comments)   Causes tightness in calf muscles            Followup:   Follow-up Information     Alexis Frock, MD Follow up on 02/24/2022.   Specialty: Urology Why: at 2:30 PM for MD visit and pathology review Contact information: [place] [address] [phone]

## 2022-02-18 DIAGNOSIS — C642 Malignant neoplasm of left kidney, except renal pelvis: Secondary | ICD-10-CM | POA: Diagnosis not present

## 2022-02-18 DIAGNOSIS — C649 Malignant neoplasm of unspecified kidney, except renal pelvis: Secondary | ICD-10-CM | POA: Diagnosis not present

## 2022-02-18 DIAGNOSIS — K6389 Other specified diseases of intestine: Secondary | ICD-10-CM | POA: Diagnosis not present

## 2022-02-24 DIAGNOSIS — N202 Calculus of kidney with calculus of ureter: Secondary | ICD-10-CM | POA: Diagnosis not present

## 2022-02-24 DIAGNOSIS — R1013 Epigastric pain: Secondary | ICD-10-CM | POA: Diagnosis not present

## 2022-02-24 DIAGNOSIS — C642 Malignant neoplasm of left kidney, except renal pelvis: Secondary | ICD-10-CM | POA: Diagnosis not present

## 2022-03-03 DIAGNOSIS — F419 Anxiety disorder, unspecified: Secondary | ICD-10-CM | POA: Diagnosis not present

## 2022-03-03 DIAGNOSIS — H9319 Tinnitus, unspecified ear: Secondary | ICD-10-CM | POA: Diagnosis not present

## 2022-03-03 DIAGNOSIS — Z85528 Personal history of other malignant neoplasm of kidney: Secondary | ICD-10-CM | POA: Diagnosis not present

## 2022-03-05 DIAGNOSIS — M25552 Pain in left hip: Secondary | ICD-10-CM | POA: Diagnosis not present

## 2022-03-07 DIAGNOSIS — H5213 Myopia, bilateral: Secondary | ICD-10-CM | POA: Diagnosis not present

## 2022-03-07 DIAGNOSIS — H52222 Regular astigmatism, left eye: Secondary | ICD-10-CM | POA: Diagnosis not present

## 2022-03-07 DIAGNOSIS — H02831 Dermatochalasis of right upper eyelid: Secondary | ICD-10-CM | POA: Diagnosis not present

## 2022-03-07 DIAGNOSIS — H524 Presbyopia: Secondary | ICD-10-CM | POA: Diagnosis not present

## 2022-03-07 DIAGNOSIS — H02834 Dermatochalasis of left upper eyelid: Secondary | ICD-10-CM | POA: Diagnosis not present

## 2022-03-07 DIAGNOSIS — H04123 Dry eye syndrome of bilateral lacrimal glands: Secondary | ICD-10-CM | POA: Diagnosis not present

## 2022-03-07 DIAGNOSIS — H25813 Combined forms of age-related cataract, bilateral: Secondary | ICD-10-CM | POA: Diagnosis not present

## 2022-03-19 DIAGNOSIS — Z23 Encounter for immunization: Secondary | ICD-10-CM | POA: Diagnosis not present

## 2022-03-28 DIAGNOSIS — Z79899 Other long term (current) drug therapy: Secondary | ICD-10-CM | POA: Diagnosis not present

## 2022-03-28 DIAGNOSIS — M5136 Other intervertebral disc degeneration, lumbar region: Secondary | ICD-10-CM | POA: Diagnosis not present

## 2022-03-28 DIAGNOSIS — M1991 Primary osteoarthritis, unspecified site: Secondary | ICD-10-CM | POA: Diagnosis not present

## 2022-03-28 DIAGNOSIS — M0609 Rheumatoid arthritis without rheumatoid factor, multiple sites: Secondary | ICD-10-CM | POA: Diagnosis not present

## 2022-03-28 DIAGNOSIS — Z6822 Body mass index (BMI) 22.0-22.9, adult: Secondary | ICD-10-CM | POA: Diagnosis not present

## 2022-04-10 DIAGNOSIS — Z905 Acquired absence of kidney: Secondary | ICD-10-CM | POA: Diagnosis not present

## 2022-04-10 DIAGNOSIS — M1990 Unspecified osteoarthritis, unspecified site: Secondary | ICD-10-CM | POA: Diagnosis not present

## 2022-04-10 DIAGNOSIS — Z888 Allergy status to other drugs, medicaments and biological substances status: Secondary | ICD-10-CM | POA: Diagnosis not present

## 2022-04-10 DIAGNOSIS — Z881 Allergy status to other antibiotic agents status: Secondary | ICD-10-CM | POA: Diagnosis not present

## 2022-04-10 DIAGNOSIS — Z885 Allergy status to narcotic agent status: Secondary | ICD-10-CM | POA: Diagnosis not present

## 2022-04-10 DIAGNOSIS — Z886 Allergy status to analgesic agent status: Secondary | ICD-10-CM | POA: Diagnosis not present

## 2022-04-10 DIAGNOSIS — Z8739 Personal history of other diseases of the musculoskeletal system and connective tissue: Secondary | ICD-10-CM | POA: Diagnosis not present

## 2022-04-10 DIAGNOSIS — C642 Malignant neoplasm of left kidney, except renal pelvis: Secondary | ICD-10-CM | POA: Diagnosis not present

## 2022-04-10 DIAGNOSIS — Z882 Allergy status to sulfonamides status: Secondary | ICD-10-CM | POA: Diagnosis not present

## 2022-04-10 DIAGNOSIS — M069 Rheumatoid arthritis, unspecified: Secondary | ICD-10-CM | POA: Diagnosis not present

## 2022-04-16 DIAGNOSIS — M25551 Pain in right hip: Secondary | ICD-10-CM | POA: Diagnosis not present

## 2022-04-22 DIAGNOSIS — M25551 Pain in right hip: Secondary | ICD-10-CM | POA: Diagnosis not present

## 2022-04-23 DIAGNOSIS — Z23 Encounter for immunization: Secondary | ICD-10-CM | POA: Diagnosis not present

## 2022-05-12 DIAGNOSIS — K5901 Slow transit constipation: Secondary | ICD-10-CM | POA: Diagnosis not present

## 2022-05-14 DIAGNOSIS — M25551 Pain in right hip: Secondary | ICD-10-CM | POA: Diagnosis not present

## 2022-05-21 DIAGNOSIS — E7800 Pure hypercholesterolemia, unspecified: Secondary | ICD-10-CM | POA: Diagnosis not present

## 2022-05-21 DIAGNOSIS — Z79899 Other long term (current) drug therapy: Secondary | ICD-10-CM | POA: Diagnosis not present

## 2022-05-22 DIAGNOSIS — D1801 Hemangioma of skin and subcutaneous tissue: Secondary | ICD-10-CM | POA: Diagnosis not present

## 2022-05-22 DIAGNOSIS — L821 Other seborrheic keratosis: Secondary | ICD-10-CM | POA: Diagnosis not present

## 2022-05-22 DIAGNOSIS — D224 Melanocytic nevi of scalp and neck: Secondary | ICD-10-CM | POA: Diagnosis not present

## 2022-05-22 DIAGNOSIS — D225 Melanocytic nevi of trunk: Secondary | ICD-10-CM | POA: Diagnosis not present

## 2022-05-22 DIAGNOSIS — L814 Other melanin hyperpigmentation: Secondary | ICD-10-CM | POA: Diagnosis not present

## 2022-05-22 DIAGNOSIS — L820 Inflamed seborrheic keratosis: Secondary | ICD-10-CM | POA: Diagnosis not present

## 2022-05-22 DIAGNOSIS — Z85828 Personal history of other malignant neoplasm of skin: Secondary | ICD-10-CM | POA: Diagnosis not present

## 2022-05-22 DIAGNOSIS — L720 Epidermal cyst: Secondary | ICD-10-CM | POA: Diagnosis not present

## 2022-05-27 DIAGNOSIS — Z905 Acquired absence of kidney: Secondary | ICD-10-CM | POA: Diagnosis not present

## 2022-05-27 DIAGNOSIS — Z6822 Body mass index (BMI) 22.0-22.9, adult: Secondary | ICD-10-CM | POA: Diagnosis not present

## 2022-05-27 DIAGNOSIS — E7800 Pure hypercholesterolemia, unspecified: Secondary | ICD-10-CM | POA: Diagnosis not present

## 2022-05-27 DIAGNOSIS — M0600 Rheumatoid arthritis without rheumatoid factor, unspecified site: Secondary | ICD-10-CM | POA: Diagnosis not present

## 2022-05-27 DIAGNOSIS — G43109 Migraine with aura, not intractable, without status migrainosus: Secondary | ICD-10-CM | POA: Diagnosis not present

## 2022-05-27 DIAGNOSIS — N1832 Chronic kidney disease, stage 3b: Secondary | ICD-10-CM | POA: Diagnosis not present

## 2022-05-29 DIAGNOSIS — M25551 Pain in right hip: Secondary | ICD-10-CM | POA: Diagnosis not present

## 2022-06-18 DIAGNOSIS — M1991 Primary osteoarthritis, unspecified site: Secondary | ICD-10-CM | POA: Diagnosis not present

## 2022-06-18 DIAGNOSIS — R5383 Other fatigue: Secondary | ICD-10-CM | POA: Diagnosis not present

## 2022-06-18 DIAGNOSIS — Z79899 Other long term (current) drug therapy: Secondary | ICD-10-CM | POA: Diagnosis not present

## 2022-06-18 DIAGNOSIS — M5136 Other intervertebral disc degeneration, lumbar region: Secondary | ICD-10-CM | POA: Diagnosis not present

## 2022-06-18 DIAGNOSIS — M064 Inflammatory polyarthropathy: Secondary | ICD-10-CM | POA: Diagnosis not present

## 2022-06-18 DIAGNOSIS — Z6822 Body mass index (BMI) 22.0-22.9, adult: Secondary | ICD-10-CM | POA: Diagnosis not present

## 2022-06-18 DIAGNOSIS — M0609 Rheumatoid arthritis without rheumatoid factor, multiple sites: Secondary | ICD-10-CM | POA: Diagnosis not present

## 2022-06-25 DIAGNOSIS — M0609 Rheumatoid arthritis without rheumatoid factor, multiple sites: Secondary | ICD-10-CM | POA: Diagnosis not present

## 2022-07-16 DIAGNOSIS — Z1231 Encounter for screening mammogram for malignant neoplasm of breast: Secondary | ICD-10-CM | POA: Diagnosis not present

## 2022-07-24 DIAGNOSIS — M0609 Rheumatoid arthritis without rheumatoid factor, multiple sites: Secondary | ICD-10-CM | POA: Diagnosis not present

## 2022-07-30 DIAGNOSIS — M47816 Spondylosis without myelopathy or radiculopathy, lumbar region: Secondary | ICD-10-CM | POA: Diagnosis not present

## 2022-08-01 DIAGNOSIS — Z1389 Encounter for screening for other disorder: Secondary | ICD-10-CM | POA: Diagnosis not present

## 2022-08-01 DIAGNOSIS — Z0000 Encounter for general adult medical examination without abnormal findings: Secondary | ICD-10-CM | POA: Diagnosis not present

## 2022-08-01 DIAGNOSIS — Z6822 Body mass index (BMI) 22.0-22.9, adult: Secondary | ICD-10-CM | POA: Diagnosis not present

## 2022-08-08 ENCOUNTER — Ambulatory Visit (HOSPITAL_COMMUNITY)
Admission: RE | Admit: 2022-08-08 | Discharge: 2022-08-08 | Disposition: A | Discharge status: Home / Self Care | Payer: Medicare Other | Source: Ambulatory Visit | Attending: Urology | Admitting: Urology

## 2022-08-08 ENCOUNTER — Other Ambulatory Visit (HOSPITAL_COMMUNITY): Payer: Self-pay | Admitting: Urology

## 2022-08-08 DIAGNOSIS — J439 Emphysema, unspecified: Secondary | ICD-10-CM | POA: Diagnosis not present

## 2022-08-08 DIAGNOSIS — C642 Malignant neoplasm of left kidney, except renal pelvis: Secondary | ICD-10-CM

## 2022-09-04 DIAGNOSIS — Z905 Acquired absence of kidney: Secondary | ICD-10-CM | POA: Diagnosis not present

## 2022-09-04 DIAGNOSIS — C642 Malignant neoplasm of left kidney, except renal pelvis: Secondary | ICD-10-CM | POA: Diagnosis not present

## 2022-09-08 DIAGNOSIS — Z905 Acquired absence of kidney: Secondary | ICD-10-CM | POA: Diagnosis not present

## 2022-09-08 DIAGNOSIS — C642 Malignant neoplasm of left kidney, except renal pelvis: Secondary | ICD-10-CM | POA: Diagnosis not present

## 2022-09-16 DIAGNOSIS — M0609 Rheumatoid arthritis without rheumatoid factor, multiple sites: Secondary | ICD-10-CM | POA: Diagnosis not present

## 2022-09-16 DIAGNOSIS — Z79899 Other long term (current) drug therapy: Secondary | ICD-10-CM | POA: Diagnosis not present

## 2022-09-16 DIAGNOSIS — M1991 Primary osteoarthritis, unspecified site: Secondary | ICD-10-CM | POA: Diagnosis not present

## 2022-09-16 DIAGNOSIS — Z6823 Body mass index (BMI) 23.0-23.9, adult: Secondary | ICD-10-CM | POA: Diagnosis not present

## 2022-09-16 DIAGNOSIS — M5136 Other intervertebral disc degeneration, lumbar region: Secondary | ICD-10-CM | POA: Diagnosis not present

## 2022-09-18 DIAGNOSIS — M0609 Rheumatoid arthritis without rheumatoid factor, multiple sites: Secondary | ICD-10-CM | POA: Diagnosis not present

## 2022-09-23 DIAGNOSIS — E7800 Pure hypercholesterolemia, unspecified: Secondary | ICD-10-CM | POA: Diagnosis not present

## 2022-09-23 DIAGNOSIS — Z905 Acquired absence of kidney: Secondary | ICD-10-CM | POA: Diagnosis not present

## 2022-09-23 DIAGNOSIS — Z85528 Personal history of other malignant neoplasm of kidney: Secondary | ICD-10-CM | POA: Diagnosis not present

## 2022-09-23 DIAGNOSIS — N1832 Chronic kidney disease, stage 3b: Secondary | ICD-10-CM | POA: Diagnosis not present

## 2022-09-23 DIAGNOSIS — M0600 Rheumatoid arthritis without rheumatoid factor, unspecified site: Secondary | ICD-10-CM | POA: Diagnosis not present

## 2022-09-23 DIAGNOSIS — I700 Atherosclerosis of aorta: Secondary | ICD-10-CM | POA: Diagnosis not present

## 2022-09-23 DIAGNOSIS — N952 Postmenopausal atrophic vaginitis: Secondary | ICD-10-CM | POA: Diagnosis not present

## 2022-10-02 DIAGNOSIS — E7800 Pure hypercholesterolemia, unspecified: Secondary | ICD-10-CM | POA: Diagnosis not present

## 2022-10-02 DIAGNOSIS — M8580 Other specified disorders of bone density and structure, unspecified site: Secondary | ICD-10-CM | POA: Diagnosis not present

## 2022-10-02 DIAGNOSIS — N1832 Chronic kidney disease, stage 3b: Secondary | ICD-10-CM | POA: Diagnosis not present

## 2022-10-02 DIAGNOSIS — M0600 Rheumatoid arthritis without rheumatoid factor, unspecified site: Secondary | ICD-10-CM | POA: Diagnosis not present

## 2022-10-16 DIAGNOSIS — Z85828 Personal history of other malignant neoplasm of skin: Secondary | ICD-10-CM | POA: Diagnosis not present

## 2022-10-16 DIAGNOSIS — L820 Inflamed seborrheic keratosis: Secondary | ICD-10-CM | POA: Diagnosis not present

## 2022-10-16 DIAGNOSIS — L821 Other seborrheic keratosis: Secondary | ICD-10-CM | POA: Diagnosis not present

## 2022-10-21 DIAGNOSIS — M47812 Spondylosis without myelopathy or radiculopathy, cervical region: Secondary | ICD-10-CM | POA: Diagnosis not present

## 2022-11-12 DIAGNOSIS — Z85828 Personal history of other malignant neoplasm of skin: Secondary | ICD-10-CM | POA: Diagnosis not present

## 2022-11-12 DIAGNOSIS — R21 Rash and other nonspecific skin eruption: Secondary | ICD-10-CM | POA: Diagnosis not present

## 2022-11-19 DIAGNOSIS — Z79899 Other long term (current) drug therapy: Secondary | ICD-10-CM | POA: Diagnosis not present

## 2022-11-19 DIAGNOSIS — M0609 Rheumatoid arthritis without rheumatoid factor, multiple sites: Secondary | ICD-10-CM | POA: Diagnosis not present

## 2022-11-21 DIAGNOSIS — N9089 Other specified noninflammatory disorders of vulva and perineum: Secondary | ICD-10-CM | POA: Diagnosis not present

## 2022-12-05 DIAGNOSIS — M25552 Pain in left hip: Secondary | ICD-10-CM | POA: Diagnosis not present

## 2022-12-05 DIAGNOSIS — M545 Low back pain, unspecified: Secondary | ICD-10-CM | POA: Diagnosis not present

## 2022-12-05 DIAGNOSIS — M25551 Pain in right hip: Secondary | ICD-10-CM | POA: Diagnosis not present

## 2022-12-05 DIAGNOSIS — M542 Cervicalgia: Secondary | ICD-10-CM | POA: Diagnosis not present

## 2022-12-05 DIAGNOSIS — M79641 Pain in right hand: Secondary | ICD-10-CM | POA: Diagnosis not present

## 2022-12-16 DIAGNOSIS — M545 Low back pain, unspecified: Secondary | ICD-10-CM | POA: Diagnosis not present

## 2022-12-16 DIAGNOSIS — M25552 Pain in left hip: Secondary | ICD-10-CM | POA: Diagnosis not present

## 2022-12-26 DIAGNOSIS — M545 Low back pain, unspecified: Secondary | ICD-10-CM | POA: Diagnosis not present

## 2022-12-26 DIAGNOSIS — M79641 Pain in right hand: Secondary | ICD-10-CM | POA: Diagnosis not present

## 2023-01-02 DIAGNOSIS — M79644 Pain in right finger(s): Secondary | ICD-10-CM | POA: Diagnosis not present

## 2023-01-09 DIAGNOSIS — M25641 Stiffness of right hand, not elsewhere classified: Secondary | ICD-10-CM | POA: Diagnosis not present

## 2023-01-12 DIAGNOSIS — M0609 Rheumatoid arthritis without rheumatoid factor, multiple sites: Secondary | ICD-10-CM | POA: Diagnosis not present

## 2023-01-12 DIAGNOSIS — M1991 Primary osteoarthritis, unspecified site: Secondary | ICD-10-CM | POA: Diagnosis not present

## 2023-01-12 DIAGNOSIS — Z79899 Other long term (current) drug therapy: Secondary | ICD-10-CM | POA: Diagnosis not present

## 2023-01-12 DIAGNOSIS — R21 Rash and other nonspecific skin eruption: Secondary | ICD-10-CM | POA: Diagnosis not present

## 2023-01-12 DIAGNOSIS — M5136 Other intervertebral disc degeneration, lumbar region: Secondary | ICD-10-CM | POA: Diagnosis not present

## 2023-01-12 DIAGNOSIS — E663 Overweight: Secondary | ICD-10-CM | POA: Diagnosis not present

## 2023-01-12 DIAGNOSIS — Z6823 Body mass index (BMI) 23.0-23.9, adult: Secondary | ICD-10-CM | POA: Diagnosis not present

## 2023-01-23 DIAGNOSIS — M25641 Stiffness of right hand, not elsewhere classified: Secondary | ICD-10-CM | POA: Diagnosis not present

## 2023-01-23 DIAGNOSIS — M79644 Pain in right finger(s): Secondary | ICD-10-CM | POA: Diagnosis not present

## 2023-01-27 DIAGNOSIS — M25642 Stiffness of left hand, not elsewhere classified: Secondary | ICD-10-CM | POA: Diagnosis not present

## 2023-01-29 DIAGNOSIS — M0609 Rheumatoid arthritis without rheumatoid factor, multiple sites: Secondary | ICD-10-CM | POA: Diagnosis not present

## 2023-01-30 DIAGNOSIS — Z8262 Family history of osteoporosis: Secondary | ICD-10-CM | POA: Diagnosis not present

## 2023-01-30 DIAGNOSIS — C649 Malignant neoplasm of unspecified kidney, except renal pelvis: Secondary | ICD-10-CM | POA: Diagnosis not present

## 2023-01-30 DIAGNOSIS — M069 Rheumatoid arthritis, unspecified: Secondary | ICD-10-CM | POA: Diagnosis not present

## 2023-01-30 DIAGNOSIS — M8588 Other specified disorders of bone density and structure, other site: Secondary | ICD-10-CM | POA: Diagnosis not present

## 2023-02-13 DIAGNOSIS — M79644 Pain in right finger(s): Secondary | ICD-10-CM | POA: Diagnosis not present

## 2023-03-04 DIAGNOSIS — Z23 Encounter for immunization: Secondary | ICD-10-CM | POA: Diagnosis not present

## 2023-03-06 DIAGNOSIS — M79644 Pain in right finger(s): Secondary | ICD-10-CM | POA: Diagnosis not present

## 2023-03-09 DIAGNOSIS — M8580 Other specified disorders of bone density and structure, unspecified site: Secondary | ICD-10-CM | POA: Diagnosis not present

## 2023-03-09 DIAGNOSIS — E7800 Pure hypercholesterolemia, unspecified: Secondary | ICD-10-CM | POA: Diagnosis not present

## 2023-03-09 DIAGNOSIS — E559 Vitamin D deficiency, unspecified: Secondary | ICD-10-CM | POA: Diagnosis not present

## 2023-03-09 DIAGNOSIS — Z79899 Other long term (current) drug therapy: Secondary | ICD-10-CM | POA: Diagnosis not present

## 2023-03-12 DIAGNOSIS — M8580 Other specified disorders of bone density and structure, unspecified site: Secondary | ICD-10-CM | POA: Diagnosis not present

## 2023-03-12 DIAGNOSIS — N1832 Chronic kidney disease, stage 3b: Secondary | ICD-10-CM | POA: Diagnosis not present

## 2023-03-12 DIAGNOSIS — Z6821 Body mass index (BMI) 21.0-21.9, adult: Secondary | ICD-10-CM | POA: Diagnosis not present

## 2023-03-12 DIAGNOSIS — E7800 Pure hypercholesterolemia, unspecified: Secondary | ICD-10-CM | POA: Diagnosis not present

## 2023-03-12 DIAGNOSIS — G4709 Other insomnia: Secondary | ICD-10-CM | POA: Diagnosis not present

## 2023-03-12 DIAGNOSIS — N3281 Overactive bladder: Secondary | ICD-10-CM | POA: Diagnosis not present

## 2023-03-18 DIAGNOSIS — H04123 Dry eye syndrome of bilateral lacrimal glands: Secondary | ICD-10-CM | POA: Diagnosis not present

## 2023-03-18 DIAGNOSIS — H52222 Regular astigmatism, left eye: Secondary | ICD-10-CM | POA: Diagnosis not present

## 2023-03-18 DIAGNOSIS — H2512 Age-related nuclear cataract, left eye: Secondary | ICD-10-CM | POA: Diagnosis not present

## 2023-03-18 DIAGNOSIS — H43813 Vitreous degeneration, bilateral: Secondary | ICD-10-CM | POA: Diagnosis not present

## 2023-03-18 DIAGNOSIS — H25811 Combined forms of age-related cataract, right eye: Secondary | ICD-10-CM | POA: Diagnosis not present

## 2023-03-18 DIAGNOSIS — H524 Presbyopia: Secondary | ICD-10-CM | POA: Diagnosis not present

## 2023-03-18 DIAGNOSIS — H5213 Myopia, bilateral: Secondary | ICD-10-CM | POA: Diagnosis not present

## 2023-03-19 DIAGNOSIS — Z23 Encounter for immunization: Secondary | ICD-10-CM | POA: Diagnosis not present

## 2023-03-26 DIAGNOSIS — M20011 Mallet finger of right finger(s): Secondary | ICD-10-CM | POA: Diagnosis not present

## 2023-03-26 DIAGNOSIS — M0609 Rheumatoid arthritis without rheumatoid factor, multiple sites: Secondary | ICD-10-CM | POA: Diagnosis not present

## 2023-04-03 DIAGNOSIS — M79644 Pain in right finger(s): Secondary | ICD-10-CM | POA: Diagnosis not present

## 2023-04-14 DIAGNOSIS — M1991 Primary osteoarthritis, unspecified site: Secondary | ICD-10-CM | POA: Diagnosis not present

## 2023-04-14 DIAGNOSIS — M545 Low back pain, unspecified: Secondary | ICD-10-CM | POA: Diagnosis not present

## 2023-04-14 DIAGNOSIS — Z6823 Body mass index (BMI) 23.0-23.9, adult: Secondary | ICD-10-CM | POA: Diagnosis not present

## 2023-04-14 DIAGNOSIS — Z79899 Other long term (current) drug therapy: Secondary | ICD-10-CM | POA: Diagnosis not present

## 2023-04-14 DIAGNOSIS — M0609 Rheumatoid arthritis without rheumatoid factor, multiple sites: Secondary | ICD-10-CM | POA: Diagnosis not present

## 2023-05-11 DIAGNOSIS — M47816 Spondylosis without myelopathy or radiculopathy, lumbar region: Secondary | ICD-10-CM | POA: Diagnosis not present

## 2023-05-21 DIAGNOSIS — Z79899 Other long term (current) drug therapy: Secondary | ICD-10-CM | POA: Diagnosis not present

## 2023-05-21 DIAGNOSIS — Z111 Encounter for screening for respiratory tuberculosis: Secondary | ICD-10-CM | POA: Diagnosis not present

## 2023-05-21 DIAGNOSIS — R5383 Other fatigue: Secondary | ICD-10-CM | POA: Diagnosis not present

## 2023-05-21 DIAGNOSIS — M0609 Rheumatoid arthritis without rheumatoid factor, multiple sites: Secondary | ICD-10-CM | POA: Diagnosis not present

## 2023-05-25 DIAGNOSIS — N1832 Chronic kidney disease, stage 3b: Secondary | ICD-10-CM | POA: Diagnosis not present

## 2023-05-25 DIAGNOSIS — G4709 Other insomnia: Secondary | ICD-10-CM | POA: Diagnosis not present

## 2023-05-25 DIAGNOSIS — M8580 Other specified disorders of bone density and structure, unspecified site: Secondary | ICD-10-CM | POA: Diagnosis not present

## 2023-05-25 DIAGNOSIS — E7800 Pure hypercholesterolemia, unspecified: Secondary | ICD-10-CM | POA: Diagnosis not present

## 2023-05-26 DIAGNOSIS — L820 Inflamed seborrheic keratosis: Secondary | ICD-10-CM | POA: Diagnosis not present

## 2023-05-26 DIAGNOSIS — L578 Other skin changes due to chronic exposure to nonionizing radiation: Secondary | ICD-10-CM | POA: Diagnosis not present

## 2023-05-26 DIAGNOSIS — L814 Other melanin hyperpigmentation: Secondary | ICD-10-CM | POA: Diagnosis not present

## 2023-05-26 DIAGNOSIS — L438 Other lichen planus: Secondary | ICD-10-CM | POA: Diagnosis not present

## 2023-05-26 DIAGNOSIS — L821 Other seborrheic keratosis: Secondary | ICD-10-CM | POA: Diagnosis not present

## 2023-05-26 DIAGNOSIS — Z85828 Personal history of other malignant neoplasm of skin: Secondary | ICD-10-CM | POA: Diagnosis not present

## 2023-05-26 DIAGNOSIS — L218 Other seborrheic dermatitis: Secondary | ICD-10-CM | POA: Diagnosis not present

## 2023-05-26 DIAGNOSIS — D1801 Hemangioma of skin and subcutaneous tissue: Secondary | ICD-10-CM | POA: Diagnosis not present

## 2023-05-26 DIAGNOSIS — L570 Actinic keratosis: Secondary | ICD-10-CM | POA: Diagnosis not present

## 2023-05-29 DIAGNOSIS — H5213 Myopia, bilateral: Secondary | ICD-10-CM | POA: Diagnosis not present

## 2023-05-29 DIAGNOSIS — H0100A Unspecified blepharitis right eye, upper and lower eyelids: Secondary | ICD-10-CM | POA: Diagnosis not present

## 2023-05-29 DIAGNOSIS — H524 Presbyopia: Secondary | ICD-10-CM | POA: Diagnosis not present

## 2023-05-29 DIAGNOSIS — H0100B Unspecified blepharitis left eye, upper and lower eyelids: Secondary | ICD-10-CM | POA: Diagnosis not present

## 2023-05-29 DIAGNOSIS — H2513 Age-related nuclear cataract, bilateral: Secondary | ICD-10-CM | POA: Diagnosis not present

## 2023-05-29 DIAGNOSIS — H04123 Dry eye syndrome of bilateral lacrimal glands: Secondary | ICD-10-CM | POA: Diagnosis not present

## 2023-05-29 DIAGNOSIS — H25013 Cortical age-related cataract, bilateral: Secondary | ICD-10-CM | POA: Diagnosis not present

## 2023-05-29 DIAGNOSIS — H43813 Vitreous degeneration, bilateral: Secondary | ICD-10-CM | POA: Diagnosis not present

## 2023-05-29 DIAGNOSIS — H25041 Posterior subcapsular polar age-related cataract, right eye: Secondary | ICD-10-CM | POA: Diagnosis not present

## 2023-06-11 DIAGNOSIS — M47816 Spondylosis without myelopathy or radiculopathy, lumbar region: Secondary | ICD-10-CM | POA: Diagnosis not present

## 2023-06-11 DIAGNOSIS — M4316 Spondylolisthesis, lumbar region: Secondary | ICD-10-CM | POA: Diagnosis not present

## 2023-06-23 DIAGNOSIS — H04123 Dry eye syndrome of bilateral lacrimal glands: Secondary | ICD-10-CM | POA: Diagnosis not present

## 2023-06-23 DIAGNOSIS — H2513 Age-related nuclear cataract, bilateral: Secondary | ICD-10-CM | POA: Diagnosis not present

## 2023-06-23 DIAGNOSIS — H5000 Unspecified esotropia: Secondary | ICD-10-CM | POA: Diagnosis not present

## 2023-06-24 ENCOUNTER — Other Ambulatory Visit: Payer: Self-pay | Admitting: Ophthalmology

## 2023-06-24 DIAGNOSIS — H5051 Esophoria: Secondary | ICD-10-CM

## 2023-06-24 DIAGNOSIS — H532 Diplopia: Secondary | ICD-10-CM

## 2023-06-24 DIAGNOSIS — M5451 Vertebrogenic low back pain: Secondary | ICD-10-CM | POA: Diagnosis not present

## 2023-07-01 DIAGNOSIS — M5451 Vertebrogenic low back pain: Secondary | ICD-10-CM | POA: Diagnosis not present

## 2023-07-07 ENCOUNTER — Ambulatory Visit
Admission: RE | Admit: 2023-07-07 | Discharge: 2023-07-07 | Disposition: A | Discharge status: Home / Self Care | Payer: Medicare Other | Source: Ambulatory Visit | Attending: Ophthalmology | Admitting: Ophthalmology

## 2023-07-07 DIAGNOSIS — R519 Headache, unspecified: Secondary | ICD-10-CM | POA: Diagnosis not present

## 2023-07-07 DIAGNOSIS — H532 Diplopia: Secondary | ICD-10-CM | POA: Diagnosis not present

## 2023-07-07 DIAGNOSIS — R42 Dizziness and giddiness: Secondary | ICD-10-CM | POA: Diagnosis not present

## 2023-07-07 DIAGNOSIS — H5051 Esophoria: Secondary | ICD-10-CM

## 2023-07-07 MED ORDER — GADOPICLENOL 0.5 MMOL/ML IV SOLN
6.0000 mL | Freq: Once | INTRAVENOUS | Status: AC | PRN
  Administered 2023-07-07: 6 mL via INTRAVENOUS

## 2023-07-14 DIAGNOSIS — M5451 Vertebrogenic low back pain: Secondary | ICD-10-CM | POA: Diagnosis not present

## 2023-07-16 DIAGNOSIS — M0609 Rheumatoid arthritis without rheumatoid factor, multiple sites: Secondary | ICD-10-CM | POA: Diagnosis not present

## 2023-07-17 ENCOUNTER — Other Ambulatory Visit: Payer: Self-pay

## 2023-07-22 DIAGNOSIS — Z1231 Encounter for screening mammogram for malignant neoplasm of breast: Secondary | ICD-10-CM | POA: Diagnosis not present

## 2023-07-30 DIAGNOSIS — H4922 Sixth [abducent] nerve palsy, left eye: Secondary | ICD-10-CM | POA: Diagnosis not present

## 2023-08-04 DIAGNOSIS — Z1331 Encounter for screening for depression: Secondary | ICD-10-CM | POA: Diagnosis not present

## 2023-08-04 DIAGNOSIS — Z6823 Body mass index (BMI) 23.0-23.9, adult: Secondary | ICD-10-CM | POA: Diagnosis not present

## 2023-08-04 DIAGNOSIS — Z0000 Encounter for general adult medical examination without abnormal findings: Secondary | ICD-10-CM | POA: Diagnosis not present

## 2023-08-11 DIAGNOSIS — M0609 Rheumatoid arthritis without rheumatoid factor, multiple sites: Secondary | ICD-10-CM | POA: Diagnosis not present

## 2023-08-11 DIAGNOSIS — R42 Dizziness and giddiness: Secondary | ICD-10-CM | POA: Diagnosis not present

## 2023-08-11 DIAGNOSIS — M8580 Other specified disorders of bone density and structure, unspecified site: Secondary | ICD-10-CM | POA: Diagnosis not present

## 2023-08-11 DIAGNOSIS — F320 Major depressive disorder, single episode, mild: Secondary | ICD-10-CM | POA: Diagnosis not present

## 2023-08-11 DIAGNOSIS — M1991 Primary osteoarthritis, unspecified site: Secondary | ICD-10-CM | POA: Diagnosis not present

## 2023-08-11 DIAGNOSIS — M2550 Pain in unspecified joint: Secondary | ICD-10-CM | POA: Diagnosis not present

## 2023-08-11 DIAGNOSIS — Z79899 Other long term (current) drug therapy: Secondary | ICD-10-CM | POA: Diagnosis not present

## 2023-08-11 DIAGNOSIS — Z6824 Body mass index (BMI) 24.0-24.9, adult: Secondary | ICD-10-CM | POA: Diagnosis not present

## 2023-08-11 DIAGNOSIS — M810 Age-related osteoporosis without current pathological fracture: Secondary | ICD-10-CM | POA: Diagnosis not present

## 2023-08-12 DIAGNOSIS — M5451 Vertebrogenic low back pain: Secondary | ICD-10-CM | POA: Diagnosis not present

## 2023-09-08 ENCOUNTER — Ambulatory Visit (HOSPITAL_COMMUNITY)
Admission: RE | Admit: 2023-09-08 | Discharge: 2023-09-08 | Disposition: A | Discharge status: Home / Self Care | Source: Ambulatory Visit | Attending: Urology | Admitting: Urology

## 2023-09-08 ENCOUNTER — Other Ambulatory Visit (HOSPITAL_COMMUNITY): Payer: Self-pay | Admitting: Urology

## 2023-09-08 DIAGNOSIS — Z905 Acquired absence of kidney: Secondary | ICD-10-CM | POA: Diagnosis not present

## 2023-09-08 DIAGNOSIS — C642 Malignant neoplasm of left kidney, except renal pelvis: Secondary | ICD-10-CM | POA: Diagnosis not present

## 2023-09-08 DIAGNOSIS — M47816 Spondylosis without myelopathy or radiculopathy, lumbar region: Secondary | ICD-10-CM | POA: Diagnosis not present

## 2023-09-08 DIAGNOSIS — Z85528 Personal history of other malignant neoplasm of kidney: Secondary | ICD-10-CM | POA: Diagnosis not present

## 2023-09-08 DIAGNOSIS — M4316 Spondylolisthesis, lumbar region: Secondary | ICD-10-CM | POA: Diagnosis not present

## 2023-09-10 DIAGNOSIS — C642 Malignant neoplasm of left kidney, except renal pelvis: Secondary | ICD-10-CM | POA: Diagnosis not present

## 2023-09-10 DIAGNOSIS — Z905 Acquired absence of kidney: Secondary | ICD-10-CM | POA: Diagnosis not present

## 2023-09-10 DIAGNOSIS — M0609 Rheumatoid arthritis without rheumatoid factor, multiple sites: Secondary | ICD-10-CM | POA: Diagnosis not present

## 2023-09-17 DIAGNOSIS — C642 Malignant neoplasm of left kidney, except renal pelvis: Secondary | ICD-10-CM | POA: Diagnosis not present

## 2023-09-17 DIAGNOSIS — Z905 Acquired absence of kidney: Secondary | ICD-10-CM | POA: Diagnosis not present

## 2023-09-30 DIAGNOSIS — M4316 Spondylolisthesis, lumbar region: Secondary | ICD-10-CM | POA: Diagnosis not present

## 2023-09-30 DIAGNOSIS — M47817 Spondylosis without myelopathy or radiculopathy, lumbosacral region: Secondary | ICD-10-CM | POA: Diagnosis not present

## 2023-09-30 DIAGNOSIS — M5136 Other intervertebral disc degeneration, lumbar region with discogenic back pain only: Secondary | ICD-10-CM | POA: Diagnosis not present

## 2023-09-30 DIAGNOSIS — M47816 Spondylosis without myelopathy or radiculopathy, lumbar region: Secondary | ICD-10-CM | POA: Diagnosis not present

## 2023-10-08 DIAGNOSIS — M4316 Spondylolisthesis, lumbar region: Secondary | ICD-10-CM | POA: Diagnosis not present

## 2023-10-12 DIAGNOSIS — L820 Inflamed seborrheic keratosis: Secondary | ICD-10-CM | POA: Diagnosis not present

## 2023-10-12 DIAGNOSIS — Z85828 Personal history of other malignant neoplasm of skin: Secondary | ICD-10-CM | POA: Diagnosis not present

## 2023-10-20 DIAGNOSIS — M5451 Vertebrogenic low back pain: Secondary | ICD-10-CM | POA: Diagnosis not present

## 2023-10-26 DIAGNOSIS — M47816 Spondylosis without myelopathy or radiculopathy, lumbar region: Secondary | ICD-10-CM | POA: Diagnosis not present

## 2023-10-26 DIAGNOSIS — M4316 Spondylolisthesis, lumbar region: Secondary | ICD-10-CM | POA: Diagnosis not present

## 2023-11-04 DIAGNOSIS — Z1211 Encounter for screening for malignant neoplasm of colon: Secondary | ICD-10-CM | POA: Diagnosis not present

## 2023-11-05 DIAGNOSIS — M0609 Rheumatoid arthritis without rheumatoid factor, multiple sites: Secondary | ICD-10-CM | POA: Diagnosis not present

## 2023-11-06 DIAGNOSIS — M5410 Radiculopathy, site unspecified: Secondary | ICD-10-CM | POA: Diagnosis not present

## 2023-11-06 DIAGNOSIS — M25551 Pain in right hip: Secondary | ICD-10-CM | POA: Diagnosis not present

## 2023-11-10 DIAGNOSIS — M47816 Spondylosis without myelopathy or radiculopathy, lumbar region: Secondary | ICD-10-CM | POA: Diagnosis not present

## 2023-11-23 DIAGNOSIS — E7800 Pure hypercholesterolemia, unspecified: Secondary | ICD-10-CM | POA: Diagnosis not present

## 2023-11-23 DIAGNOSIS — N952 Postmenopausal atrophic vaginitis: Secondary | ICD-10-CM | POA: Diagnosis not present

## 2023-11-23 DIAGNOSIS — Z7184 Encounter for health counseling related to travel: Secondary | ICD-10-CM | POA: Diagnosis not present

## 2023-11-23 DIAGNOSIS — M8580 Other specified disorders of bone density and structure, unspecified site: Secondary | ICD-10-CM | POA: Diagnosis not present

## 2023-11-23 DIAGNOSIS — G4709 Other insomnia: Secondary | ICD-10-CM | POA: Diagnosis not present

## 2023-11-24 DIAGNOSIS — M1611 Unilateral primary osteoarthritis, right hip: Secondary | ICD-10-CM | POA: Diagnosis not present

## 2023-11-24 DIAGNOSIS — M8580 Other specified disorders of bone density and structure, unspecified site: Secondary | ICD-10-CM | POA: Diagnosis not present

## 2023-11-24 DIAGNOSIS — M25551 Pain in right hip: Secondary | ICD-10-CM | POA: Diagnosis not present

## 2023-11-26 DIAGNOSIS — H25041 Posterior subcapsular polar age-related cataract, right eye: Secondary | ICD-10-CM | POA: Diagnosis not present

## 2023-11-26 DIAGNOSIS — H04123 Dry eye syndrome of bilateral lacrimal glands: Secondary | ICD-10-CM | POA: Diagnosis not present

## 2023-11-26 DIAGNOSIS — H25013 Cortical age-related cataract, bilateral: Secondary | ICD-10-CM | POA: Diagnosis not present

## 2023-11-26 DIAGNOSIS — H4922 Sixth [abducent] nerve palsy, left eye: Secondary | ICD-10-CM | POA: Diagnosis not present

## 2023-11-26 DIAGNOSIS — H2513 Age-related nuclear cataract, bilateral: Secondary | ICD-10-CM | POA: Diagnosis not present

## 2023-11-26 DIAGNOSIS — H524 Presbyopia: Secondary | ICD-10-CM | POA: Diagnosis not present

## 2023-11-26 DIAGNOSIS — H5213 Myopia, bilateral: Secondary | ICD-10-CM | POA: Diagnosis not present

## 2023-11-26 DIAGNOSIS — H43813 Vitreous degeneration, bilateral: Secondary | ICD-10-CM | POA: Diagnosis not present

## 2023-11-26 DIAGNOSIS — H52202 Unspecified astigmatism, left eye: Secondary | ICD-10-CM | POA: Diagnosis not present

## 2023-12-01 DIAGNOSIS — H4922 Sixth [abducent] nerve palsy, left eye: Secondary | ICD-10-CM | POA: Diagnosis not present

## 2023-12-03 DIAGNOSIS — M810 Age-related osteoporosis without current pathological fracture: Secondary | ICD-10-CM | POA: Diagnosis not present

## 2023-12-09 DIAGNOSIS — M5451 Vertebrogenic low back pain: Secondary | ICD-10-CM | POA: Diagnosis not present

## 2023-12-29 DIAGNOSIS — D124 Benign neoplasm of descending colon: Secondary | ICD-10-CM | POA: Diagnosis not present

## 2023-12-29 DIAGNOSIS — Z1211 Encounter for screening for malignant neoplasm of colon: Secondary | ICD-10-CM | POA: Diagnosis not present

## 2023-12-29 DIAGNOSIS — K5730 Diverticulosis of large intestine without perforation or abscess without bleeding: Secondary | ICD-10-CM | POA: Diagnosis not present

## 2023-12-31 DIAGNOSIS — M0609 Rheumatoid arthritis without rheumatoid factor, multiple sites: Secondary | ICD-10-CM | POA: Diagnosis not present

## 2024-01-01 DIAGNOSIS — D124 Benign neoplasm of descending colon: Secondary | ICD-10-CM | POA: Diagnosis not present

## 2024-01-07 DIAGNOSIS — M792 Neuralgia and neuritis, unspecified: Secondary | ICD-10-CM | POA: Diagnosis not present

## 2024-01-07 DIAGNOSIS — M00152 Pneumococcal arthritis, left hip: Secondary | ICD-10-CM | POA: Diagnosis not present

## 2024-01-07 DIAGNOSIS — M1611 Unilateral primary osteoarthritis, right hip: Secondary | ICD-10-CM | POA: Diagnosis not present

## 2024-01-18 DIAGNOSIS — M47816 Spondylosis without myelopathy or radiculopathy, lumbar region: Secondary | ICD-10-CM | POA: Diagnosis not present

## 2024-02-09 DIAGNOSIS — M810 Age-related osteoporosis without current pathological fracture: Secondary | ICD-10-CM | POA: Diagnosis not present

## 2024-02-09 DIAGNOSIS — Z79899 Other long term (current) drug therapy: Secondary | ICD-10-CM | POA: Diagnosis not present

## 2024-02-09 DIAGNOSIS — M1991 Primary osteoarthritis, unspecified site: Secondary | ICD-10-CM | POA: Diagnosis not present

## 2024-02-09 DIAGNOSIS — M0609 Rheumatoid arthritis without rheumatoid factor, multiple sites: Secondary | ICD-10-CM | POA: Diagnosis not present

## 2024-02-09 DIAGNOSIS — Z6824 Body mass index (BMI) 24.0-24.9, adult: Secondary | ICD-10-CM | POA: Diagnosis not present

## 2024-02-25 DIAGNOSIS — M4316 Spondylolisthesis, lumbar region: Secondary | ICD-10-CM | POA: Diagnosis not present

## 2024-02-25 DIAGNOSIS — M47816 Spondylosis without myelopathy or radiculopathy, lumbar region: Secondary | ICD-10-CM | POA: Diagnosis not present

## 2024-02-25 DIAGNOSIS — M0609 Rheumatoid arthritis without rheumatoid factor, multiple sites: Secondary | ICD-10-CM | POA: Diagnosis not present

## 2024-03-14 DIAGNOSIS — H2512 Age-related nuclear cataract, left eye: Secondary | ICD-10-CM | POA: Diagnosis not present

## 2024-03-14 DIAGNOSIS — Z961 Presence of intraocular lens: Secondary | ICD-10-CM | POA: Diagnosis not present

## 2024-03-14 DIAGNOSIS — H25812 Combined forms of age-related cataract, left eye: Secondary | ICD-10-CM | POA: Diagnosis not present

## 2024-03-14 DIAGNOSIS — H25042 Posterior subcapsular polar age-related cataract, left eye: Secondary | ICD-10-CM | POA: Diagnosis not present

## 2024-03-14 DIAGNOSIS — H52222 Regular astigmatism, left eye: Secondary | ICD-10-CM | POA: Diagnosis not present

## 2024-03-14 DIAGNOSIS — H25012 Cortical age-related cataract, left eye: Secondary | ICD-10-CM | POA: Diagnosis not present

## 2024-03-15 DIAGNOSIS — Z79899 Other long term (current) drug therapy: Secondary | ICD-10-CM | POA: Diagnosis not present

## 2024-03-15 DIAGNOSIS — M8580 Other specified disorders of bone density and structure, unspecified site: Secondary | ICD-10-CM | POA: Diagnosis not present

## 2024-03-15 DIAGNOSIS — E7800 Pure hypercholesterolemia, unspecified: Secondary | ICD-10-CM | POA: Diagnosis not present

## 2024-03-16 DIAGNOSIS — Z23 Encounter for immunization: Secondary | ICD-10-CM | POA: Diagnosis not present

## 2024-03-17 DIAGNOSIS — M542 Cervicalgia: Secondary | ICD-10-CM | POA: Diagnosis not present

## 2024-03-17 DIAGNOSIS — M5451 Vertebrogenic low back pain: Secondary | ICD-10-CM | POA: Diagnosis not present

## 2024-03-24 DIAGNOSIS — G4709 Other insomnia: Secondary | ICD-10-CM | POA: Diagnosis not present

## 2024-03-24 DIAGNOSIS — N1832 Chronic kidney disease, stage 3b: Secondary | ICD-10-CM | POA: Diagnosis not present

## 2024-03-24 DIAGNOSIS — Z6823 Body mass index (BMI) 23.0-23.9, adult: Secondary | ICD-10-CM | POA: Diagnosis not present

## 2024-03-24 DIAGNOSIS — M8580 Other specified disorders of bone density and structure, unspecified site: Secondary | ICD-10-CM | POA: Diagnosis not present

## 2024-03-24 DIAGNOSIS — M0600 Rheumatoid arthritis without rheumatoid factor, unspecified site: Secondary | ICD-10-CM | POA: Diagnosis not present

## 2024-03-24 DIAGNOSIS — E7800 Pure hypercholesterolemia, unspecified: Secondary | ICD-10-CM | POA: Diagnosis not present

## 2024-03-24 DIAGNOSIS — K130 Diseases of lips: Secondary | ICD-10-CM | POA: Diagnosis not present

## 2024-03-24 DIAGNOSIS — Z23 Encounter for immunization: Secondary | ICD-10-CM | POA: Diagnosis not present

## 2024-03-24 DIAGNOSIS — G43109 Migraine with aura, not intractable, without status migrainosus: Secondary | ICD-10-CM | POA: Diagnosis not present

## 2024-03-24 DIAGNOSIS — N952 Postmenopausal atrophic vaginitis: Secondary | ICD-10-CM | POA: Diagnosis not present

## 2024-03-25 DIAGNOSIS — M069 Rheumatoid arthritis, unspecified: Secondary | ICD-10-CM | POA: Diagnosis not present

## 2024-03-25 DIAGNOSIS — M1611 Unilateral primary osteoarthritis, right hip: Secondary | ICD-10-CM | POA: Diagnosis not present

## 2024-03-25 DIAGNOSIS — M160 Bilateral primary osteoarthritis of hip: Secondary | ICD-10-CM | POA: Diagnosis not present

## 2024-03-28 DIAGNOSIS — H25041 Posterior subcapsular polar age-related cataract, right eye: Secondary | ICD-10-CM | POA: Diagnosis not present

## 2024-03-28 DIAGNOSIS — H2511 Age-related nuclear cataract, right eye: Secondary | ICD-10-CM | POA: Diagnosis not present

## 2024-03-28 DIAGNOSIS — Z961 Presence of intraocular lens: Secondary | ICD-10-CM | POA: Diagnosis not present

## 2024-03-28 DIAGNOSIS — H25811 Combined forms of age-related cataract, right eye: Secondary | ICD-10-CM | POA: Diagnosis not present

## 2024-03-28 DIAGNOSIS — H25011 Cortical age-related cataract, right eye: Secondary | ICD-10-CM | POA: Diagnosis not present

## 2024-04-12 DIAGNOSIS — M4316 Spondylolisthesis, lumbar region: Secondary | ICD-10-CM | POA: Diagnosis not present

## 2024-04-13 ENCOUNTER — Other Ambulatory Visit: Payer: Self-pay | Admitting: Neurosurgery

## 2024-04-21 DIAGNOSIS — R5383 Other fatigue: Secondary | ICD-10-CM | POA: Diagnosis not present

## 2024-04-21 DIAGNOSIS — Z111 Encounter for screening for respiratory tuberculosis: Secondary | ICD-10-CM | POA: Diagnosis not present

## 2024-04-21 DIAGNOSIS — M0609 Rheumatoid arthritis without rheumatoid factor, multiple sites: Secondary | ICD-10-CM | POA: Diagnosis not present

## 2024-04-22 NOTE — Progress Notes (Signed)
 Surgical Instructions   Your procedure is scheduled on Monday, December 1st. Report to [facility] Main Entrance A at 8:00 A.M., then check in with the Admitting office. Any questions or running late day of surgery: call [phone]  Questions prior to your surgery date: call [phone], Monday-Friday, 8am-4pm. If you experience any cold or flu symptoms such as cough, fever, chills, shortness of breath, etc. between now and your scheduled surgery, please notify us  at the above number.     Remember:  Do not eat or drink after midnight the night before your surgery. This includes no gum, mints, or hard candy.  Take these medicines the morning of surgery with A SIP OF WATER   simvastatin  (ZOCOR )    May take these medicines IF NEEDED: carboxymethylcellulose (REFRESH PLUS) eye drops  SUMAtriptan  (IMITREX )   One week prior to surgery, STOP taking any Aspirin  (unless otherwise instructed by your surgeon) Aleve, Naproxen, Ibuprofen, Motrin, Advil, Goody's, BC's, all herbal medications, fish oil, and non-prescription vitamins.                     Do NOT Smoke (Tobacco/Vaping) for 24 hours prior to your procedure.  If you use a CPAP at night, you may bring your mask/headgear for your overnight stay.   You will be asked to remove any contacts, glasses, piercing's, hearing aid's, dentures/partials prior to surgery. Please bring cases for these items if needed.    Patients discharged the day of surgery will not be allowed to drive home, and someone needs to stay with them for 24 hours.  SURGICAL WAITING ROOM VISITATION Patients may have no more than 2 support people in the waiting area - these visitors may rotate.   Pre-op nurse will coordinate an appropriate time for 1 ADULT support person, who may not rotate, to accompany patient in pre-op.  Children under the age of 31 must have an adult with them who is not the patient and must remain in the main waiting area with an adult.  If the  patient needs to stay at the hospital during part of their recovery, the visitor guidelines for inpatient rooms apply.  Please refer to the [facility] website for the visitor guidelines for any additional information.   If you received a COVID test during your pre-op visit  it is requested that you wear a mask when out in public, stay away from anyone that may not be feeling well and notify your surgeon if you develop symptoms. If you have been in contact with anyone that has tested positive in the last 10 days please notify you surgeon.      Pre-operative 4 CHG Bathing Instructions   You can play a key role in reducing the risk of infection after surgery. Your skin needs to be as free of germs as possible. You can reduce the number of germs on your skin by washing with CHG (chlorhexidine  gluconate) soap before surgery. CHG is an antiseptic soap that kills germs and continues to kill germs even after washing.   DO NOT use if you have an allergy to chlorhexidine /CHG or antibacterial soaps. If your skin becomes reddened or irritated, stop using the CHG and notify one of our RNs at [phone].   Please shower with the CHG soap starting 4 days before surgery using the following schedule:     Please keep in mind the following:  DO NOT shave, including legs and underarms, starting the day of your first shower.   You  may shave your face at any point before/day of surgery.  Place clean sheets on your bed the day you start using CHG soap. Use a clean washcloth (not used since being washed) for each shower. DO NOT sleep with pets once you start using the CHG.   CHG Shower Instructions:  Wash your face and private area with normal soap. If you choose to wash your hair, wash first with your normal shampoo.  After you use shampoo/soap, rinse your hair and body thoroughly to remove shampoo/soap residue.  Turn the water  OFF and apply  bottle of CHG soap to a CLEAN washcloth.  Apply CHG soap ONLY  FROM YOUR NECK DOWN TO YOUR TOES (washing for 3-5 minutes)  DO NOT use CHG soap on face, private areas, open wounds, or sores.  Pay special attention to the area where your surgery is being performed.  If you are having back surgery, having someone wash your back for you may be helpful. Wait 2 minutes after CHG soap is applied, then you may rinse off the CHG soap.  Pat dry with a clean towel  Put on clean clothes/pajamas   If you choose to wear lotion, please use ONLY the CHG-compatible lotions that are listed below.  Additional instructions for the day of surgery:  If you choose, you may shower the morning of surgery with an antibacterial soap.  DO NOT APPLY any lotions, deodorants, or perfumes.   Do not bring valuables to the hospital. [facility] is not responsible for any belongings/valuables. Do not wear nail polish, gel polish, artificial nails, or any other type of covering on natural nails (fingers and toes) Do not wear jewelry or makeup Put on clean/comfortable clothes.  Please brush your teeth.  Ask your nurse before applying any prescription medications to the skin.     CHG Compatible Lotions   Aveeno Moisturizing lotion  Cetaphil Moisturizing Cream  Cetaphil Moisturizing Lotion  Clairol Herbal Essence Moisturizing Lotion, Dry Skin  Clairol Herbal Essence Moisturizing Lotion, Extra Dry Skin  Clairol Herbal Essence Moisturizing Lotion, Normal Skin  Curel Age Defying Therapeutic Moisturizing Lotion with Alpha Hydroxy  Curel Extreme Care Body Lotion  Curel Soothing Hands Moisturizing Hand Lotion  Curel Therapeutic Moisturizing Cream, Fragrance-Free  Curel Therapeutic Moisturizing Lotion, Fragrance-Free  Curel Therapeutic Moisturizing Lotion, Original Formula  Eucerin Daily Replenishing Lotion  Eucerin Dry Skin Therapy Plus Alpha Hydroxy Crme  Eucerin Dry Skin Therapy Plus Alpha Hydroxy Lotion  Eucerin Original Crme  Eucerin Original Lotion  Eucerin Plus Crme  Eucerin Plus Lotion  Eucerin TriLipid Replenishing Lotion  Keri Anti-Bacterial Hand Lotion  Keri Deep Conditioning Original Lotion Dry Skin Formula Softly Scented  Keri Deep Conditioning Original Lotion, Fragrance Free Sensitive Skin Formula  Keri Lotion Fast Absorbing Fragrance Free Sensitive Skin Formula  Keri Lotion Fast Absorbing Softly Scented Dry Skin Formula  Keri Original Lotion  Keri Skin Renewal Lotion Keri Silky Smooth Lotion  Keri Silky Smooth Sensitive Skin Lotion  Nivea Body Creamy Conditioning Oil  Nivea Body Extra Enriched Lotion  Nivea Body Original Lotion  Nivea Body Sheer Moisturizing Lotion Nivea Crme  Nivea Skin Firming Lotion  NutraDerm 30 Skin Lotion  NutraDerm Skin Lotion  NutraDerm Therapeutic Skin Cream  NutraDerm Therapeutic Skin Lotion  ProShield Protective Hand Cream  Provon moisturizing lotion  Please read over the following fact sheets that you were given.

## 2024-04-25 ENCOUNTER — Other Ambulatory Visit: Payer: Self-pay

## 2024-04-25 ENCOUNTER — Encounter (HOSPITAL_COMMUNITY)
Admission: RE | Admit: 2024-04-25 | Discharge: 2024-04-25 | Disposition: A | Discharge status: Home / Self Care | Source: Ambulatory Visit | Attending: Neurosurgery | Admitting: Neurosurgery

## 2024-04-25 ENCOUNTER — Encounter (HOSPITAL_COMMUNITY): Payer: Self-pay

## 2024-04-25 VITALS — BP 135/72 | HR 81 | Temp 98.2°F | Resp 20 | Ht 63.0 in | Wt 137.9 lb

## 2024-04-25 DIAGNOSIS — Z85828 Personal history of other malignant neoplasm of skin: Secondary | ICD-10-CM | POA: Diagnosis not present

## 2024-04-25 DIAGNOSIS — F32A Depression, unspecified: Secondary | ICD-10-CM | POA: Diagnosis not present

## 2024-04-25 DIAGNOSIS — M4316 Spondylolisthesis, lumbar region: Secondary | ICD-10-CM | POA: Insufficient documentation

## 2024-04-25 DIAGNOSIS — Z01818 Encounter for other preprocedural examination: Secondary | ICD-10-CM | POA: Insufficient documentation

## 2024-04-25 DIAGNOSIS — F419 Anxiety disorder, unspecified: Secondary | ICD-10-CM | POA: Diagnosis not present

## 2024-04-25 DIAGNOSIS — K219 Gastro-esophageal reflux disease without esophagitis: Secondary | ICD-10-CM | POA: Insufficient documentation

## 2024-04-25 DIAGNOSIS — E785 Hyperlipidemia, unspecified: Secondary | ICD-10-CM | POA: Insufficient documentation

## 2024-04-25 DIAGNOSIS — N189 Chronic kidney disease, unspecified: Secondary | ICD-10-CM | POA: Insufficient documentation

## 2024-04-25 DIAGNOSIS — D631 Anemia in chronic kidney disease: Secondary | ICD-10-CM | POA: Insufficient documentation

## 2024-04-25 DIAGNOSIS — Z981 Arthrodesis status: Secondary | ICD-10-CM | POA: Insufficient documentation

## 2024-04-25 DIAGNOSIS — Z905 Acquired absence of kidney: Secondary | ICD-10-CM | POA: Insufficient documentation

## 2024-04-25 DIAGNOSIS — M0600 Rheumatoid arthritis without rheumatoid factor, unspecified site: Secondary | ICD-10-CM | POA: Diagnosis not present

## 2024-04-25 DIAGNOSIS — Z85528 Personal history of other malignant neoplasm of kidney: Secondary | ICD-10-CM | POA: Insufficient documentation

## 2024-04-25 DIAGNOSIS — Z01812 Encounter for preprocedural laboratory examination: Secondary | ICD-10-CM | POA: Diagnosis present

## 2024-04-25 DIAGNOSIS — Z01810 Encounter for preprocedural cardiovascular examination: Secondary | ICD-10-CM | POA: Diagnosis present

## 2024-04-25 HISTORY — DX: Chronic kidney disease, unspecified: N18.9

## 2024-04-25 LAB — TYPE AND SCREEN
ABO/RH(D): A POS
Antibody Screen: NEGATIVE

## 2024-04-25 LAB — CBC
HCT: 38.8 % (ref 36.0–46.0)
Hemoglobin: 12.8 g/dL (ref 12.0–15.0)
MCH: 31.8 pg (ref 26.0–34.0)
MCHC: 33 g/dL (ref 30.0–36.0)
MCV: 96.5 fL (ref 80.0–100.0)
Platelets: 189 K/uL (ref 150–400)
RBC: 4.02 MIL/uL (ref 3.87–5.11)
RDW: 12.3 % (ref 11.5–15.5)
WBC: 7.7 K/uL (ref 4.0–10.5)
nRBC: 0 % (ref 0.0–0.2)

## 2024-04-25 LAB — BASIC METABOLIC PANEL WITH GFR
Anion gap: 11 (ref 5–15)
BUN: 25 mg/dL — ABNORMAL HIGH (ref 8–23)
CO2: 19 mmol/L — ABNORMAL LOW (ref 22–32)
Calcium: 9 mg/dL (ref 8.9–10.3)
Chloride: 108 mmol/L (ref 98–111)
Creatinine, Ser: 1.11 mg/dL — ABNORMAL HIGH (ref 0.44–1.00)
GFR, Estimated: 52 mL/min — ABNORMAL LOW (ref >60)
Glucose, Bld: 92 mg/dL (ref 70–99)
Potassium: 3.9 mmol/L (ref 3.5–5.1)
Sodium: 138 mmol/L (ref 135–145)

## 2024-04-25 LAB — SURGICAL PCR SCREEN

## 2024-04-25 NOTE — Progress Notes (Signed)
 Patient requesting any pain medications that will filter through her liver instead of her kidney (only has the right kidney left after kidney cancer).   PCP - Olam Pinal Cardiologist - Denies  PPM/ICD - Denies Device Orders - n/a Rep Notified - n/a  Chest x-ray - n/a EKG - 04-25-24 Stress Test - denies ECHO - denies Cardiac Cath - denies  Sleep Study - denies CPAP - n/a  NON-diabetic  Last dose of GLP1 agonist-  denies GLP1 instructions: n/a  Blood Thinner Instructions: denies Aspirin  Instructions:denies  ERAS Protcol - npo PRE-SURGERY Ensure or G2- none  COVID TEST- n/a   Anesthesia review: Yes, miltral valve prolapse, ckd3, new ekg  Patient denies shortness of breath, fever, cough and chest pain at PAT appointment   All instructions explained to the patient, with a verbal understanding of the material. Patient agrees to go over the instructions while at home for a better understanding. Patient also instructed to self quarantine after being tested for COVID-19. The opportunity to ask questions was provided.

## 2024-04-26 ENCOUNTER — Encounter (HOSPITAL_COMMUNITY): Payer: Self-pay

## 2024-04-26 NOTE — Anesthesia Preprocedure Evaluation (Addendum)
 Anesthesia Evaluation  Patient identified by MRN, date of birth, ID band Patient awake    Reviewed: Allergy & Precautions, H&P , NPO status , Patient's Chart, lab work & pertinent test results  History of Anesthesia Complications (+) PONV and history of anesthetic complications  Airway Mallampati: II   Neck ROM: full    Dental   Pulmonary neg pulmonary ROS   breath sounds clear to auscultation       Cardiovascular negative cardio ROS  Rhythm:regular Rate:Normal     Neuro/Psych  Headaches PSYCHIATRIC DISORDERS Anxiety Depression       GI/Hepatic ,GERD  ,,  Endo/Other    Renal/GU      Musculoskeletal  (+) Arthritis ,    Abdominal   Peds  Hematology   Anesthesia Other Findings   Reproductive/Obstetrics                              Anesthesia Physical Anesthesia Plan  ASA: 2  Anesthesia Plan: General   Post-op Pain Management:    Induction: Intravenous  PONV Risk Score and Plan: 4 or greater and Ondansetron , Dexamethasone  and Treatment may vary due to age or medical condition  Airway Management Planned: Oral ETT  Additional Equipment:   Intra-op Plan:   Post-operative Plan: Extubation in OR  Informed Consent: I have reviewed the patients History and Physical, chart, labs and discussed the procedure including the risks, benefits and alternatives for the proposed anesthesia with the patient or authorized representative who has indicated his/her understanding and acceptance.     Dental advisory given  Plan Discussed with: CRNA, Anesthesiologist and Surgeon  Anesthesia Plan Comments: (See PAT note written 04/26/2024 by Allison Zelenak, PA-C.  )         Anesthesia Quick Evaluation

## 2024-04-26 NOTE — Progress Notes (Signed)
 Surgical PCR result invalid. Will need to be recollected DOS. Order placed.

## 2024-04-26 NOTE — Progress Notes (Signed)
 Anesthesia Chart Review:  Case: [id number] Date/Time: 05/09/24 0945   Procedure: POSTERIOR LUMBAR FUSION 1 LEVEL (Back) - PLIF - L4-L5 - Posterior Lateral and Interbody fusion   Anesthesia type: General   Diagnosis: Spondylolisthesis, lumbar region [M43.16]   Pre-op diagnosis: Spondylolisthesis lumbar region   [facility] / [facility]   Surgeons: Onetha Kuba, MD       DISCUSSION: Patient is a 73 year old female scheduled for the above procedure.  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 Prior to her 05/2014 ACDF, she spoke with an anesthesiologist at her PAT visit due to experiencing effects of anesthesia for six month after a prior surgery.   She had a neuro-ophthalmology follow-up at [facility] by Gelene Door, MD on 12/01/2023 for diplopia with left gaze noted ~ 2024. Prior blepharoplasty x2, last in there 60's. She had trochleitis a few months after blepharoplasty that resolved after a steroid injection. 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 MRI brain/orbit was unrevealing for structural etiology. No intraorbital inflammation seen on exam. Labs to evaluate for myasthenia gravis and thyroid  eye disease were sent and were within normal limits (see [facility] Everywhere).  Surgical PCR result was invalid so plan to recollect on the day of surgery.  Anesthesia team to evaluate on the day of surgery. She is concerned about type of pain medication. Given her nephrectomy and CKD, she want to avoid nephrotoxic medications. Creatinine 1.11, BUN 25, eGFR 52.    VS: BP 135/72   Pulse 81   Temp 36.8 C (Oral)   Resp 20   Ht 5' 3 (1.6 m)   Wt 62.6 kg   SpO2 100%   BMI 24.43  kg/m   PROVIDERS: Cleotilde Planas, MD is PCP Ishmael Slough, MD is rheumatologist Alvaro Rosalynn Raddle, MD is urologist   LABS: Labs reviewed: Acceptable for surgery. For nasal PCR on the day of surgery.  (all labs ordered are listed, but only abnormal results are displayed)  Labs Reviewed  SURGICAL PCR SCREEN - Abnormal; Notable for the following components:      Result Value   MRSA, PCR   (*)    Value: INVALID, UNABLE TO DETERMINE THE PRESENCE OF TARGET DUE TO SPECIMEN INTEGRITY. RECOLLECTION REQUESTED.   Staphylococcus aureus   (*)    Value: INVALID, UNABLE TO DETERMINE THE PRESENCE OF TARGET DUE TO SPECIMEN INTEGRITY. RECOLLECTION REQUESTED.   All other components within normal limits  BASIC METABOLIC PANEL WITH GFR - Abnormal; Notable for the following components:   CO2 19 (*)    BUN 25 (*)    Creatinine, Ser 1.11 (*)    GFR, Estimated 52 (*)    All other components within normal limits  CBC  TYPE AND SCREEN     IMAGES: MRI L-spine 09/30/2023 (Canopy/PACS): IMPRESSION: 1. L2-3: Moderate bulging of the disc. Facet and ligamentous hypertrophy. Stenosis of both lateral recesses with some potential for neural compression, more on the left than the right. 2. L4-5: Advanced bilateral facet arthropathy with anterolisthesis of 5 mm. Mild bulging of the disc. Stenosis of the lateral recesses and foramina that could be symptomatic. The facet arthritis would likely be painful. 3. L5-S1: Ordinary facet arthritis without slippage or encroachment upon the neural structures.  CXR 09/08/2023: FINDINGS: - Cardiac silhouette is normal in size and configuration. No mediastinal or hilar masses. No evidence of adenopathy. - Lungs are clear.  No pleural effusion or pneumothorax. - Skeletal structures are intact. IMPRESSION: No active cardiopulmonary disease.   MRI Brain and orbits 07/07/2023: MPRESSION: 1. No acute intracranial process. No etiology is seen for the patient's symptoms. 2.  Normal MRI of the orbits.    EKG: 04/25/2024: Normal sinus rhythm Low voltage QRS Borderline ECG When compared with ECG of 30-Jan-2022 08:36, PREVIOUS ECG IS PRESENT Confirmed by Tobb, Kardie [phone]) on 04/25/2024 5:47:14 PM   CV: US  Carotid 11/09/2013: IMPRESSION:  Very minimal amount of bile intimal wall thickening and  atherosclerotic plaque, left subjectively greater than right, not  resulting in a hemodynamically significant stenosis.   [facility] note a negative stress Cardiolite in 2002. She denied prior echo   Past Medical History:  Diagnosis Date   Anemia    Anxiety    Arthritis    Chronic kidney disease    Complication of anesthesia    Pt stated it takes like 6 months to get anesthesia out of her system   Depression    GERD (gastroesophageal reflux disease)    Headache    migraines   History of kidney stones    Hyperlipemia    Hypoglycemia    Mitral valve prolapse    PONV (postoperative nausea and vomiting)    Skin cancer    Squamous cell removed    Past Surgical History:  Procedure Laterality Date   ANTERIOR CERVICAL DECOMP/DISCECTOMY FUSION N/A 06/05/2014   Procedure: CERVICAL THREE-FOUR, CERVICAL FOUR-FIVE, CERVICAL FIVE-SIX ANTERIOR CERVICAL DECOMPRESSION/DISCECTOMY FUSION 3 LEVELS;  Surgeon: Arley SHAUNNA Helling, MD;  Location: [facility] NEURO ORS;  Service: Neurosurgery;  Laterality: N/A;   BELPHAROPTOSIS REPAIR Bilateral    BREAST REDUCTION SURGERY  06/09/1974   BUNIONECTOMY Bilateral    CARPAL TUNNEL RELEASE Bilateral    LYMPH NODE DISSECTION Left 02/05/2022   Procedure: LEFT RETROPERITONEAL LYMPH NODE DISSECTION;  Surgeon: Alvaro Hummer, MD;  Location: [facility] ORS;  Service: Urology;  Laterality: Left;   ROBOT ASSISTED LAPAROSCOPIC NEPHRECTOMY Left 02/05/2022   Procedure: XI ROBOTIC ASSISTED LAPAROSCOPIC RADICAL NEPHRECTOMY;  Surgeon: Alvaro Hummer, MD;  Location: [facility] ORS;  Service: Urology;  Laterality: Left;  3 HRS   SKIN TAG REMOVAL     SQUAMOUS CELL  CARCINOMA EXCISION  06/09/2009   removed from chest   TOE FUSION Right 03/23/2020    MEDICATIONS:  B Complex Vitamins (B COMPLEX 1 PO)   carboxymethylcellulose (REFRESH PLUS) 0.5 % SOLN   COVID-19 mRNA bivalent vaccine, Pfizer, (PFIZER COVID-19 VAC BIVALENT) injection   COVID-19 mRNA Vac-TriS, Pfizer, (PFIZER-BIONT COVID-19 VAC-TRIS) SUSP injection   denosumab  (PROLIA ) 60 MG/ML SOSY injection   estradiol (ESTRACE) 0.1 MG/GM vaginal cream   gabapentin (NEURONTIN) 300 MG capsule   golimumab  (SIMPONI  ARIA) 50 MG/4ML SOLN injection   methotrexate  (RHEUMATREX) 2.5 MG tablet   Multiple Vitamins-Minerals (CENTRUM MINIS WOMEN 50+ PO)   oxyCODONE  (OXY IR/ROXICODONE ) 5 MG immediate release tablet   polyethylene glycol (MIRALAX  / GLYCOLAX ) 17 g packet   simvastatin  (ZOCOR ) 40 MG tablet   SUMAtriptan  (IMITREX ) 50 MG tablet   No current facility-administered medications for this encounter.    Isaiah Ruder, PA-C Surgical Short Stay/Anesthesiology [facility] Phone [phone] [facility] Phone [phone] 04/26/2024 6:39 PM

## 2024-05-06 NOTE — Progress Notes (Signed)
 LVM with surgery time change for Monday 05/09/24, [id number], arrival 0530, and to follow all other previous instructions given.

## 2024-05-08 ENCOUNTER — Encounter (HOSPITAL_COMMUNITY): Payer: Self-pay | Admitting: Neurosurgery

## 2024-05-09 ENCOUNTER — Ambulatory Visit (HOSPITAL_COMMUNITY)
Admission: RE | Admit: 2024-05-09 | Discharge: 2024-05-10 | Disposition: A | Discharge status: Home / Self Care | Attending: Neurosurgery | Admitting: Neurosurgery

## 2024-05-09 ENCOUNTER — Encounter (HOSPITAL_COMMUNITY): Admission: RE | Disposition: A | Payer: Self-pay | Source: Home / Self Care | Attending: Neurosurgery

## 2024-05-09 ENCOUNTER — Encounter (HOSPITAL_COMMUNITY): Payer: Self-pay | Admitting: Neurosurgery

## 2024-05-09 ENCOUNTER — Other Ambulatory Visit: Payer: Self-pay

## 2024-05-09 ENCOUNTER — Ambulatory Visit (HOSPITAL_COMMUNITY)

## 2024-05-09 ENCOUNTER — Ambulatory Visit (HOSPITAL_COMMUNITY): Payer: Self-pay | Admitting: Vascular Surgery

## 2024-05-09 ENCOUNTER — Ambulatory Visit (HOSPITAL_COMMUNITY): Payer: Self-pay

## 2024-05-09 DIAGNOSIS — M4316 Spondylolisthesis, lumbar region: Secondary | ICD-10-CM

## 2024-05-09 DIAGNOSIS — M5416 Radiculopathy, lumbar region: Secondary | ICD-10-CM | POA: Insufficient documentation

## 2024-05-09 DIAGNOSIS — M48061 Spinal stenosis, lumbar region without neurogenic claudication: Secondary | ICD-10-CM | POA: Diagnosis present

## 2024-05-09 DIAGNOSIS — Z01818 Encounter for other preprocedural examination: Secondary | ICD-10-CM

## 2024-05-09 LAB — SURGICAL PCR SCREEN
MRSA, PCR: NEGATIVE
Staphylococcus aureus: NEGATIVE

## 2024-05-09 SURGERY — POSTERIOR LUMBAR FUSION 1 LEVEL
Anesthesia: General | Site: Back

## 2024-05-09 MED ORDER — EPHEDRINE SULFATE-NACL 50-0.9 MG/10ML-% IV SOSY
PREFILLED_SYRINGE | INTRAVENOUS | Status: DC | PRN
  Administered 2024-05-09: 10 mg via INTRAVENOUS

## 2024-05-09 MED ORDER — PROPOFOL 10 MG/ML IV BOLUS
INTRAVENOUS | Status: AC
  Filled 2024-05-09: qty 20

## 2024-05-09 MED ORDER — BUPIVACAINE LIPOSOME 1.3 % IJ SUSP
INTRAMUSCULAR | Status: AC
  Filled 2024-05-09: qty 20

## 2024-05-09 MED ORDER — OXYCODONE HCL 5 MG PO TABS
5.0000 mg | ORAL_TABLET | Freq: Four times a day (QID) | ORAL | Status: DC | PRN
  Administered 2024-05-09 – 2024-05-10 (×2): 10 mg via ORAL
  Filled 2024-05-09 (×2): qty 2

## 2024-05-09 MED ORDER — CEFAZOLIN SODIUM-DEXTROSE 2-4 GM/100ML-% IV SOLN
2.0000 g | INTRAVENOUS | Status: AC
  Administered 2024-05-09: 2 g via INTRAVENOUS
  Filled 2024-05-09: qty 100

## 2024-05-09 MED ORDER — ALBUMIN HUMAN 5 % IV SOLN: INTRAVENOUS | Status: DC | PRN

## 2024-05-09 MED ORDER — POLYETHYLENE GLYCOL 3350 17 G PO PACK: 17.0000 g | PACK | Freq: Every day | ORAL | Status: DC | PRN

## 2024-05-09 MED ORDER — HYDROMORPHONE HCL 1 MG/ML IJ SOLN
0.5000 mg | INTRAMUSCULAR | Status: DC | PRN
  Administered 2024-05-09: 0.5 mg via INTRAVENOUS
  Filled 2024-05-09: qty 0.5

## 2024-05-09 MED ORDER — CHLORHEXIDINE GLUCONATE 0.12 % MT SOLN
15.0000 mL | Freq: Once | OROMUCOSAL | Status: AC
  Administered 2024-05-09: 15 mL via OROMUCOSAL
  Filled 2024-05-09: qty 15

## 2024-05-09 MED ORDER — MIDAZOLAM HCL (PF) 2 MG/2ML IJ SOLN
INTRAMUSCULAR | Status: DC | PRN
  Administered 2024-05-09: 2 mg via INTRAVENOUS

## 2024-05-09 MED ORDER — CHLORHEXIDINE GLUCONATE CLOTH 2 % EX PADS: 6.0000 | MEDICATED_PAD | Freq: Once | CUTANEOUS | Status: DC

## 2024-05-09 MED ORDER — ROCURONIUM BROMIDE 10 MG/ML (PF) SYRINGE
PREFILLED_SYRINGE | INTRAVENOUS | Status: DC | PRN
  Administered 2024-05-09: 70 mg via INTRAVENOUS
  Administered 2024-05-09 (×3): 10 mg via INTRAVENOUS
  Administered 2024-05-09: 20 mg via INTRAVENOUS

## 2024-05-09 MED ORDER — FENTANYL CITRATE (PF) 250 MCG/5ML IJ SOLN
INTRAMUSCULAR | Status: DC | PRN
  Administered 2024-05-09 (×2): 50 ug via INTRAVENOUS

## 2024-05-09 MED ORDER — SUGAMMADEX SODIUM 200 MG/2ML IV SOLN
INTRAVENOUS | Status: DC | PRN
  Administered 2024-05-09: 200 mg via INTRAVENOUS

## 2024-05-09 MED ORDER — CHLORHEXIDINE GLUCONATE CLOTH 2 % EX PADS
6.0000 | MEDICATED_PAD | Freq: Once | CUTANEOUS | Status: AC
  Administered 2024-05-09: 6 via TOPICAL

## 2024-05-09 MED ORDER — ONDANSETRON HCL 4 MG PO TABS
4.0000 mg | ORAL_TABLET | Freq: Four times a day (QID) | ORAL | Status: DC | PRN
  Administered 2024-05-10: 4 mg via ORAL
  Filled 2024-05-09: qty 1

## 2024-05-09 MED ORDER — ADULT MULTIVITAMIN W/MINERALS CH: 1.0000 | ORAL_TABLET | Freq: Every day | ORAL | Status: DC

## 2024-05-09 MED ORDER — PROPOFOL 10 MG/ML IV BOLUS
INTRAVENOUS | Status: DC | PRN
  Administered 2024-05-09: 120 mg via INTRAVENOUS

## 2024-05-09 MED ORDER — SCOPOLAMINE 1 MG/3DAYS TD PT72
1.0000 | MEDICATED_PATCH | TRANSDERMAL | Status: DC
  Administered 2024-05-09: 1 mg via TRANSDERMAL
  Filled 2024-05-09: qty 1

## 2024-05-09 MED ORDER — POLYVINYL ALCOHOL 1.4 % OP SOLN: 1.0000 [drp] | Freq: Every day | OPHTHALMIC | Status: DC | PRN

## 2024-05-09 MED ORDER — ONDANSETRON HCL 4 MG/2ML IJ SOLN
INTRAMUSCULAR | Status: DC | PRN
  Administered 2024-05-09: 4 mg via INTRAVENOUS

## 2024-05-09 MED ORDER — BUPIVACAINE HCL (PF) 0.25 % IJ SOLN
INTRAMUSCULAR | Status: AC
  Filled 2024-05-09: qty 30

## 2024-05-09 MED ORDER — ACETAMINOPHEN 650 MG RE SUPP: 650.0000 mg | RECTAL | Status: DC | PRN

## 2024-05-09 MED ORDER — SUMATRIPTAN SUCCINATE 50 MG PO TABS: 50.0000 mg | ORAL_TABLET | ORAL | Status: DC | PRN

## 2024-05-09 MED ORDER — SIMVASTATIN 20 MG PO TABS
20.0000 mg | ORAL_TABLET | Freq: Every day | ORAL | Status: DC
  Administered 2024-05-09: 20 mg via ORAL
  Filled 2024-05-09: qty 1

## 2024-05-09 MED ORDER — SODIUM CHLORIDE 0.9% FLUSH
3.0000 mL | Freq: Two times a day (BID) | INTRAVENOUS | Status: DC
  Administered 2024-05-09: 3 mL via INTRAVENOUS

## 2024-05-09 MED ORDER — LIDOCAINE-EPINEPHRINE 1 %-1:100000 IJ SOLN
INTRAMUSCULAR | Status: DC | PRN
  Administered 2024-05-09: 10 mL

## 2024-05-09 MED ORDER — ONDANSETRON HCL 4 MG/2ML IJ SOLN: 4.0000 mg | Freq: Four times a day (QID) | INTRAMUSCULAR | Status: DC | PRN

## 2024-05-09 MED ORDER — ROCURONIUM BROMIDE 10 MG/ML (PF) SYRINGE
PREFILLED_SYRINGE | INTRAVENOUS | Status: AC
  Filled 2024-05-09: qty 10

## 2024-05-09 MED ORDER — PHENOL 1.4 % MT LIQD: 1.0000 | OROMUCOSAL | Status: DC | PRN

## 2024-05-09 MED ORDER — PHENYLEPHRINE 80 MCG/ML (10ML) SYRINGE FOR IV PUSH (FOR BLOOD PRESSURE SUPPORT)
PREFILLED_SYRINGE | INTRAVENOUS | Status: AC
  Filled 2024-05-09: qty 10

## 2024-05-09 MED ORDER — GOLIMUMAB 50 MG/4ML IV SOLN: 50.0000 mg | INTRAVENOUS | Status: DC

## 2024-05-09 MED ORDER — METHOTREXATE 2.5 MG PO TABS: 15.0000 mg | ORAL_TABLET | ORAL | Status: DC

## 2024-05-09 MED ORDER — HYDROMORPHONE HCL 1 MG/ML IJ SOLN
INTRAMUSCULAR | Status: AC
  Filled 2024-05-09: qty 0.5

## 2024-05-09 MED ORDER — FENTANYL CITRATE (PF) 100 MCG/2ML IJ SOLN
25.0000 ug | INTRAMUSCULAR | Status: DC | PRN
  Administered 2024-05-09: 50 ug via INTRAVENOUS

## 2024-05-09 MED ORDER — LIDOCAINE-EPINEPHRINE 1 %-1:100000 IJ SOLN
INTRAMUSCULAR | Status: AC
  Filled 2024-05-09: qty 1

## 2024-05-09 MED ORDER — PHENYLEPHRINE HCL-NACL 20-0.9 MG/250ML-% IV SOLN
INTRAVENOUS | Status: DC | PRN
  Administered 2024-05-09: 20 ug/min via INTRAVENOUS

## 2024-05-09 MED ORDER — LACTATED RINGERS IV SOLN: INTRAVENOUS | Status: DC

## 2024-05-09 MED ORDER — ORAL CARE MOUTH RINSE: 15.0000 mL | Freq: Once | OROMUCOSAL | Status: AC

## 2024-05-09 MED ORDER — PANTOPRAZOLE SODIUM 40 MG IV SOLR
40.0000 mg | Freq: Every day | INTRAVENOUS | Status: DC
  Administered 2024-05-09: 40 mg via INTRAVENOUS
  Filled 2024-05-09: qty 10

## 2024-05-09 MED ORDER — LIDOCAINE 2% (20 MG/ML) 5 ML SYRINGE
INTRAMUSCULAR | Status: AC
  Filled 2024-05-09: qty 5

## 2024-05-09 MED ORDER — DEXAMETHASONE SOD PHOSPHATE PF 10 MG/ML IJ SOLN
INTRAMUSCULAR | Status: DC | PRN
  Administered 2024-05-09: 10 mg via INTRAVENOUS

## 2024-05-09 MED ORDER — BUPIVACAINE LIPOSOME 1.3 % IJ SUSP
INTRAMUSCULAR | Status: DC | PRN
  Administered 2024-05-09: 20 mL

## 2024-05-09 MED ORDER — HYDROMORPHONE HCL 1 MG/ML IJ SOLN
INTRAMUSCULAR | Status: DC | PRN
  Administered 2024-05-09: .5 mg via INTRAVENOUS

## 2024-05-09 MED ORDER — MENTHOL 3 MG MT LOZG: 1.0000 | LOZENGE | OROMUCOSAL | Status: DC | PRN

## 2024-05-09 MED ORDER — ESTRADIOL 0.01 % VA CREA
2.0000 | TOPICAL_CREAM | VAGINAL | Status: DC
  Filled 2024-05-09: qty 42.5

## 2024-05-09 MED ORDER — THROMBIN 20000 UNITS EX SOLR
CUTANEOUS | Status: AC
  Filled 2024-05-09: qty 20000

## 2024-05-09 MED ORDER — SODIUM CHLORIDE 0.9 % IV SOLN
250.0000 mL | INTRAVENOUS | Status: DC
  Administered 2024-05-09: 250 mL via INTRAVENOUS

## 2024-05-09 MED ORDER — CEFAZOLIN SODIUM-DEXTROSE 2-4 GM/100ML-% IV SOLN
2.0000 g | Freq: Three times a day (TID) | INTRAVENOUS | Status: AC
  Administered 2024-05-09 – 2024-05-10 (×2): 2 g via INTRAVENOUS
  Filled 2024-05-09 (×2): qty 100

## 2024-05-09 MED ORDER — PHENYLEPHRINE 80 MCG/ML (10ML) SYRINGE FOR IV PUSH (FOR BLOOD PRESSURE SUPPORT)
PREFILLED_SYRINGE | INTRAVENOUS | Status: DC | PRN
  Administered 2024-05-09: 80 ug via INTRAVENOUS
  Administered 2024-05-09 (×3): 160 ug via INTRAVENOUS
  Administered 2024-05-09: 80 ug via INTRAVENOUS
  Administered 2024-05-09: 160 ug via INTRAVENOUS

## 2024-05-09 MED ORDER — SODIUM CHLORIDE 0.9% FLUSH: 3.0000 mL | INTRAVENOUS | Status: DC | PRN

## 2024-05-09 MED ORDER — 0.9 % SODIUM CHLORIDE (POUR BTL) OPTIME
TOPICAL | Status: DC | PRN
  Administered 2024-05-09 (×2): 1000 mL

## 2024-05-09 MED ORDER — LIDOCAINE 2% (20 MG/ML) 5 ML SYRINGE
INTRAMUSCULAR | Status: DC | PRN
  Administered 2024-05-09: 80 mg via INTRAVENOUS

## 2024-05-09 MED ORDER — ONDANSETRON HCL 4 MG/2ML IJ SOLN
4.0000 mg | Freq: Four times a day (QID) | INTRAMUSCULAR | Status: DC | PRN
  Administered 2024-05-09: 4 mg via INTRAVENOUS
  Filled 2024-05-09: qty 2

## 2024-05-09 MED ORDER — MIDAZOLAM HCL 2 MG/2ML IJ SOLN
INTRAMUSCULAR | Status: AC
  Filled 2024-05-09: qty 2

## 2024-05-09 MED ORDER — GABAPENTIN 300 MG PO CAPS: 300.0000 mg | ORAL_CAPSULE | Freq: Every evening | ORAL | Status: DC | PRN

## 2024-05-09 MED ORDER — ALUM & MAG HYDROXIDE-SIMETH 200-200-20 MG/5ML PO SUSP: 30.0000 mL | Freq: Four times a day (QID) | ORAL | Status: DC | PRN

## 2024-05-09 MED ORDER — ACETAMINOPHEN 325 MG PO TABS: 650.0000 mg | ORAL_TABLET | ORAL | Status: DC | PRN

## 2024-05-09 MED ORDER — FENTANYL CITRATE (PF) 100 MCG/2ML IJ SOLN
INTRAMUSCULAR | Status: AC
  Filled 2024-05-09: qty 2

## 2024-05-09 MED ORDER — THROMBIN 20000 UNITS EX SOLR
CUTANEOUS | Status: DC | PRN
  Administered 2024-05-09: 20 mL via TOPICAL

## 2024-05-09 MED ORDER — ONDANSETRON HCL 4 MG/2ML IJ SOLN
INTRAMUSCULAR | Status: AC
  Filled 2024-05-09: qty 2

## 2024-05-09 SURGICAL SUPPLY — 59 items
BAG COUNTER SPONGE SURGICOUNT (BAG) ×1 IMPLANT
BASKET BONE COLLECTION (BASKET) ×1 IMPLANT
BENZOIN TINCTURE PRP APPL 2/3 (GAUZE/BANDAGES/DRESSINGS) ×1 IMPLANT
BLADE BONE MILL MEDIUM (MISCELLANEOUS) ×1 IMPLANT
BLADE CLIPPER SURG (BLADE) IMPLANT
BLADE SURG 11 STRL SS (BLADE) ×1 IMPLANT
BUR CUTTER 7.0 ROUND (BURR) ×1 IMPLANT
BUR MATCHSTICK NEURO 3.0 LAGG (BURR) ×1 IMPLANT
CANISTER SUCTION 3000ML PPV (SUCTIONS) ×1 IMPLANT
CNTNR URN SCR LID CUP LEK RST (MISCELLANEOUS) ×1 IMPLANT
COVER BACK TABLE 60X90IN (DRAPES) ×1 IMPLANT
DERMABOND ADVANCED .7 DNX12 (GAUZE/BANDAGES/DRESSINGS) ×1 IMPLANT
DRAPE C-ARM 42X72 X-RAY (DRAPES) ×2 IMPLANT
DRAPE C-ARMOR (DRAPES) IMPLANT
DRAPE HALF SHEET 40X57 (DRAPES) IMPLANT
DRAPE LAPAROTOMY 100X72X124 (DRAPES) ×1 IMPLANT
DRAPE SURG 17X23 STRL (DRAPES) ×1 IMPLANT
DRSG OPSITE 4X5.5 SM (GAUZE/BANDAGES/DRESSINGS) ×1 IMPLANT
DRSG OPSITE POSTOP 4X6 (GAUZE/BANDAGES/DRESSINGS) ×1 IMPLANT
DURAPREP 26ML APPLICATOR (WOUND CARE) ×1 IMPLANT
ELECTRODE REM PT RTRN 9FT ADLT (ELECTROSURGICAL) ×1 IMPLANT
EVACUATOR 1/8 PVC DRAIN (DRAIN) IMPLANT
GAUZE 4X4 16PLY ~~LOC~~+RFID DBL (SPONGE) IMPLANT
GAUZE SPONGE 4X4 12PLY STRL (GAUZE/BANDAGES/DRESSINGS) ×1 IMPLANT
GLOVE BIO SURGEON STRL SZ7 (GLOVE) IMPLANT
GLOVE BIO SURGEON STRL SZ8 (GLOVE) ×2 IMPLANT
GLOVE BIOGEL PI IND STRL 7.0 (GLOVE) IMPLANT
GLOVE BIOGEL PI IND STRL 7.5 (GLOVE) IMPLANT
GLOVE INDICATOR 8.5 STRL (GLOVE) ×2 IMPLANT
GLOVE SURG SS PI 7.0 STRL IVOR (GLOVE) IMPLANT
GOWN STRL REUS W/ TWL LRG LVL3 (GOWN DISPOSABLE) IMPLANT
GOWN STRL REUS W/ TWL XL LVL3 (GOWN DISPOSABLE) ×2 IMPLANT
GOWN STRL REUS W/TWL 2XL LVL3 (GOWN DISPOSABLE) IMPLANT
GRAFT BNE MATRIX VG FRMBL MD 5 (Bone Implant) IMPLANT
KIT BASIN OR (CUSTOM PROCEDURE TRAY) ×1 IMPLANT
KIT TURNOVER KIT B (KITS) ×1 IMPLANT
MILL BONE PREP (MISCELLANEOUS) ×1 IMPLANT
NDL HYPO 25X1 1.5 SAFETY (NEEDLE) ×1 IMPLANT
NEEDLE HYPO 25X1 1.5 SAFETY (NEEDLE) ×1 IMPLANT
PACK LAMINECTOMY NEURO (CUSTOM PROCEDURE TRAY) ×1 IMPLANT
PAD ARMBOARD POSITIONER FOAM (MISCELLANEOUS) ×3 IMPLANT
ROD LORD LIPPED TI 5.5X35 (Rod) IMPLANT
SCREW CANC SHANK MOD 5.5X35 (Screw) IMPLANT
SCREW POLYAXIAL TULIP (Screw) IMPLANT
SET SCREW SPNE (Screw) IMPLANT
SOLN 0.9% NACL POUR BTL 1000ML (IV SOLUTION) ×1 IMPLANT
SOLN STERILE WATER BTL 1000 ML (IV SOLUTION) ×1 IMPLANT
SPACER SUSTAIN TI 9X22X12 8D (Spacer) IMPLANT
SPIKE FLUID TRANSFER (MISCELLANEOUS) ×1 IMPLANT
SPONGE SURGIFOAM ABS GEL 100 (HEMOSTASIS) ×1 IMPLANT
SPONGE T-LAP 4X18 ~~LOC~~+RFID (SPONGE) IMPLANT
STRIP CLOSURE SKIN 1/2X4 (GAUZE/BANDAGES/DRESSINGS) ×2 IMPLANT
SUT VIC AB 0 CT1 18XCR BRD8 (SUTURE) ×2 IMPLANT
SUT VIC AB 2-0 CT1 18 (SUTURE) ×1 IMPLANT
SUT VIC AB 4-0 PS2 27 (SUTURE) ×1 IMPLANT
TOWEL GREEN STERILE (TOWEL DISPOSABLE) ×1 IMPLANT
TOWEL GREEN STERILE FF (TOWEL DISPOSABLE) ×1 IMPLANT
TRAY FOLEY MTR SLVR 14FR STAT (SET/KITS/TRAYS/PACK) IMPLANT
TRAY FOLEY MTR SLVR 16FR STAT (SET/KITS/TRAYS/PACK) ×1 IMPLANT

## 2024-05-09 NOTE — H&P (Signed)
 Mary Harrell is an 73 y.o. female.   Chief Complaint: Back and leg pain HPI: 73 year old female with back and leg pain workup revealed grade 1 spondylolisthesis has been going on for years to be treated conservatively with physical therapy anti-inflammatories dry needling epidural steroid injections and facet blocks.  Finally these conservative approaches have stopped working and she is getting progressively worse with increased back pain and increased limitation.  So because of this I have recommended decompression interbody fusion at L4-5.  I extensively gone over the risks and benefits of the operation with the patient as well as perioperative course expectations of outcome and alternatives to surgery and she understood and agreed to proceed forward.  Past Medical History:  Diagnosis Date   Anemia    Anxiety    Arthritis    Chronic kidney disease    Clear cell renal cell carcinoma, left (HCC) 2023   Complication of anesthesia    Pt stated it takes like 6 months to get anesthesia out of her system   Depression    GERD (gastroesophageal reflux disease)    Headache    migraines   History of kidney stones    Hyperlipemia    Hypoglycemia    Mitral valve prolapse    PONV (postoperative nausea and vomiting)    Skin cancer    Squamous cell removed    Past Surgical History:  Procedure Laterality Date   ANTERIOR CERVICAL DECOMP/DISCECTOMY FUSION N/A 06/05/2014   Procedure: CERVICAL THREE-FOUR, CERVICAL FOUR-FIVE, CERVICAL FIVE-SIX ANTERIOR CERVICAL DECOMPRESSION/DISCECTOMY FUSION 3 LEVELS;  Surgeon: Arley SHAUNNA Helling, MD;  Location: [facility];  Service: Neurosurgery;  Laterality: N/A;   BELPHAROPTOSIS REPAIR Bilateral    BREAST REDUCTION SURGERY  06/09/1974   BUNIONECTOMY Bilateral    CARPAL TUNNEL RELEASE Bilateral    LYMPH NODE DISSECTION Left 02/05/2022   Procedure: LEFT RETROPERITONEAL LYMPH NODE DISSECTION;  Surgeon: Alvaro Hummer, MD;  Location: [facility]S;  Service: Urology;  Laterality:  Left;   ROBOT ASSISTED LAPAROSCOPIC NEPHRECTOMY Left 02/05/2022   Procedure: XI ROBOTIC ASSISTED LAPAROSCOPIC RADICAL NEPHRECTOMY;  Surgeon: Alvaro Hummer, MD;  Location: [facility]S;  Service: Urology;  Laterality: Left;  3 HRS   SKIN TAG REMOVAL     SQUAMOUS CELL CARCINOMA EXCISION  06/09/2009   removed from chest   TOE FUSION Right 03/23/2020    History reviewed. No pertinent family history. Social History:  reports that she has never smoked. She has never used smokeless tobacco. She reports current alcohol  use. She reports that she does not use drugs.  Allergies:  Allergies  Allergen Reactions   Morphine  Nausea And Vomiting   Tramadol Nausea And Vomiting and Other (See Comments)    Inability to urinate for many hours   Aspirin  Tinitus   Atovaquone Diarrhea   Celecoxib Other (See Comments)     headache   Esomeprazole Magnesium Other (See Comments)    Headache   Gabapentin Other (See Comments)    Headache   Guaifenesin-Codeine Other (See Comments)    GI Upset (intolerance)   Hydrocodone -Acetaminophen  Nausea And Vomiting   Malarone [Atovaquone-Proguanil Hcl] Other (See Comments)    GI Upset (intolerance)   Meloxicam Itching    Pt stated, If I take 15 mg, it upsets stomach, but can take 7.5 mg okay Swollen Hands   Nitrofurantoin Other (See Comments)    Other reaction(s): muscle tightness in calves,   Oxycodone  Hcl Nausea And Vomiting   Oxycodone -Acetaminophen  Other (See Comments)     GI  Upset (intolerance)   Proguanil Diarrhea   Sulfamethoxazole-Trimethoprim Other (See Comments)    Leg pain   Ciprofloxacin Other (See Comments)    Causes tightness in calf muscles    Medications Prior to Admission  Medication Sig Dispense Refill   B Complex Vitamins (B COMPLEX 1 PO) Take 1 tablet by mouth daily.     carboxymethylcellulose (REFRESH PLUS) 0.5 % SOLN Place 1 drop into both ears daily as needed (Dry eye).     estradiol  (ESTRACE ) 0.1 MG/GM vaginal cream Place 2 Applicatorfuls  vaginally every 7 (seven) days.     golimumab  (SIMPONI  ARIA) 50 MG/4ML SOLN injection Inject 50 mg into the vein every 8 (eight) weeks.     Multiple Vitamins-Minerals (CENTRUM MINIS WOMEN 50+ PO) Take 2 tablets by mouth daily.     polyethylene glycol (MIRALAX  / GLYCOLAX ) 17 g packet Take 17 g by mouth daily as needed for mild constipation or moderate constipation.     simvastatin  (ZOCOR ) 40 MG tablet Take 20 mg by mouth daily.  1   SUMAtriptan  (IMITREX ) 50 MG tablet Take 50 mg by mouth as needed for migraine.  1   COVID-19 mRNA bivalent vaccine, Pfizer, (PFIZER COVID-19 VAC BIVALENT) injection Inject into the muscle. (Patient not taking: Reported on 04/20/2024) 0.3 mL 0   COVID-19 mRNA Vac-TriS, Pfizer, (PFIZER-BIONT COVID-19 VAC-TRIS) SUSP injection Inject into the muscle. (Patient not taking: Reported on 04/20/2024) 0.3 mL 0   denosumab  (PROLIA ) 60 MG/ML SOSY injection Inject 60 mg into the skin every 6 (six) months.     gabapentin  (NEURONTIN ) 300 MG capsule Take 300 mg by mouth at bedtime as needed (pain).     methotrexate  (RHEUMATREX) 2.5 MG tablet Take 15 mg by mouth once a week. (Patient not taking: Reported on 04/20/2024)     oxyCODONE  (OXY IR/ROXICODONE ) 5 MG immediate release tablet Take 1-2 tablets (5-10 mg total) by mouth every 6 (six) hours as needed for moderate pain or severe pain. (Patient not taking: Reported on 04/20/2024) 20 tablet 0    No results found for this or any previous visit (from the past 48 hours). No results found.  Review of Systems  Musculoskeletal:  Positive for back pain.  Neurological:  Positive for numbness.    Blood pressure (!) 141/72, pulse 67, temperature 97.6 F (36.4 C), temperature source Oral, resp. rate 20, height 5' 3 (1.6 m), weight 61.2 kg, SpO2 99%. Physical Exam HENT:     Head: Normocephalic.     Right Ear: Tympanic membrane normal.     Nose: Nose normal.  Cardiovascular:     Rate and Rhythm: Normal rate.     Pulses: Normal pulses.   Pulmonary:     Effort: Pulmonary effort is normal.  Musculoskeletal:     Cervical back: Normal range of motion.  Neurological:     Mental Status: She is alert.     Comments: Patient is awake and alert strength is 5 out of 5 iliopsoas, quads, hamstrings, gastrocs, anterior tibialis EHL.      Assessment/Plan 73 year old presents for decompressive laminectomy interbody fusion L4-5  Arley SHAUNNA Helling, MD 05/09/2024, 6:57 AM

## 2024-05-09 NOTE — Anesthesia Procedure Notes (Signed)
 Procedure Name: Intubation Date/Time: 05/09/2024 9:54 AM  Performed by: Lockie Flesher, CRNAPre-anesthesia Checklist: Patient identified, Emergency Drugs available, Suction available and Patient being monitored Patient Re-evaluated:Patient Re-evaluated prior to induction Oxygen Delivery Method: Circle System Utilized Preoxygenation: Pre-oxygenation with 100% oxygen Induction Type: IV induction Ventilation: Mask ventilation without difficulty Laryngoscope Size: 3 and Glidescope Grade View: Grade I Tube type: Oral Tube size: 7.0 mm Number of attempts: 1 Airway Equipment and Method: Stylet and Oral airway Placement Confirmation: ETT inserted through vocal cords under direct vision, positive ETCO2 and breath sounds checked- equal and bilateral Secured at: 23 cm Tube secured with: Tape Dental Injury: Teeth and Oropharynx as per pre-operative assessment

## 2024-05-09 NOTE — Op Note (Signed)
 Preoperative diagnosis: Grade 1 spondylolisthesis severe spinal stenosis L4-5 bilateral L4-L5 radiculopathies.  Postoperative diagnosis: Same.  Procedure: #1 decompressive laminectomy L4-5 with complete facetectomy and radical foraminotomies of the L4-L5 nerve roots bilaterally in excess and requiring more work than would be needed with a standard interbody fusion.  2.  Posterior lumbar lumbar fusion L4-5 utilizing globus titanium cages packed with locally harvested autograft mixed with Vivigen.  3.  Cortical screw fixation L4-5 utilizing the Alphatec cortical screw modular system.  4.  Open reduction spinal deformity L4-5.  Surgeon: Arley helling.  Assistant: Suzen Click.  Anesthesia: General.  EBL: Minimal.  HPI: 73 year old female with longstanding back and bilateral hip and leg pain workup revealed grade 1 spondylolisthesis at L4-5 with severe spinal stenosis.  Due to the patient's progression of clinical syndrome imaging findings of a conservative treatment I recommended decompressive laminectomy interbody fusion at that level.  I extensively reviewed the risks and benefits of the operation with the patient as well as perioperative course expectations of outcome and alternatives to surgery and she understood and agreed to proceed forward.  Operative procedure: Patient was brought into the OR was induced under general anesthesia positioned prone on the Wilson frame her back was prepped and draped in routine sterile fashion.  Preoperative x-ray localized the appropriate level so after infiltration of 10 cc lidocaine  with epi midline incision was made and Bovie electrocautery was used to take down the subcutaneous tissue and subperiosteal dissection was carried lamina of L4 and L5 bilaterally.  Interoperative x-ray confirmed application appropriate level.  So spinous process at L4 was removed high-speed drill drilled down the facet joint central decompression was begun.  Complete facetectomies  were performed there was dense adhesions on the ligament and large medially projecting spurs to the dura this was all teased away and removed I remove the pars remove the entire medial facet under bit the superior tickling facet and performed complete and radical foraminotomies of the L4 and L5 nerve roots.  Then the space was incised sequentially distracted and this helped reduce the spinal deformity.  I then cleaned out the disc base prepared the endplates and inserted bilateral cages with an extensive mount of autograft in the cages and medially.  Then placed cortical screws all screws had excellent purchase the heads were assembled screws were advanced rods were placed everything was tightened down.  I then reinspected the foramina to confirm patency and no migration of graft material laid Gelfoam atop the dura and the muscle and fascia approximate layers with after Vicryl skin was closed running 4 subcuticular.  Dermabond benzoin Steri-Strips and a sterile dressing was applied patient to cover room in stable condition.  At the end the case all needle Sponge counts were correct.

## 2024-05-09 NOTE — Plan of Care (Signed)

## 2024-05-09 NOTE — Transfer of Care (Signed)
 Immediate Anesthesia Transfer of Care Note  Patient: Mary Harrell  Procedure(s) Performed: POSTERIOR LATERAL AND INTERBODY LUMBAR FUSION LUMBAR FOUR-LUMBAR FIVE (Back)  Patient Location: PACU  Anesthesia Type:General  Level of Consciousness: awake, alert , and oriented  Airway & Oxygen Therapy: Patient Spontanous Breathing and Patient connected to nasal cannula oxygen  Post-op Assessment: Report given to RN and Post -op Vital signs reviewed and stable  Post vital signs: Reviewed and stable  Last Vitals:  Vitals Value Taken Time  BP 126/55 05/09/24 12:45  Temp 36.5 C 05/09/24 12:34  Pulse 100 05/09/24 12:48  Resp 11 05/09/24 12:48  SpO2 93 % 05/09/24 12:48  Vitals shown include unfiled device data.  Last Pain:  Vitals:   05/09/24 1234  TempSrc:   PainSc: 0-No pain      Patients Stated Pain Goal: 1 (05/09/24 0615)  Complications: No notable events documented.

## 2024-05-10 DIAGNOSIS — M4316 Spondylolisthesis, lumbar region: Secondary | ICD-10-CM | POA: Diagnosis not present

## 2024-05-10 MED ORDER — OXYCODONE HCL 5 MG PO TABS: 5.0000 mg | ORAL_TABLET | ORAL | 0 refills | Status: AC | PRN

## 2024-05-10 MED ORDER — ONDANSETRON HCL 4 MG PO TABS: 4.0000 mg | ORAL_TABLET | Freq: Four times a day (QID) | ORAL | 0 refills | Status: AC | PRN

## 2024-05-10 NOTE — Discharge Summary (Signed)
 Physician Discharge Summary  Patient ID: Mary Harrell MRN: [id number] DOB/AGE: 1950/12/09 73 y.o.  Admit date: 05/09/2024 Discharge date: 05/10/2024  Admission Diagnoses: Grade 1 spondylolisthesis severe spinal stenosis L4-5 bilateral L4-L5 radiculopathies.     Discharge Diagnoses: same   Discharged Condition: good  Hospital Course: The patient was admitted on 05/09/2024 and taken to the operating room where the patient underwent PLIF L4-5. The patient tolerated the procedure well and was taken to the recovery room and then to the floor in stable condition. The hospital course was routine. There were no complications. The wound remained clean dry and intact. Pt had appropriate back soreness. No complaints of leg pain or new N/T/W. The patient remained afebrile with stable vital signs, and tolerated a regular diet. The patient continued to increase activities, and pain was well controlled with oral pain medications.   Consults: None  Significant Diagnostic Studies:  Results for orders placed or performed during the hospital encounter of 05/09/24  Surgical pcr screen   Collection Time: 05/09/24  5:58 AM   Specimen: Nasal Mucosa; Nasal Swab  Result Value Ref Range   MRSA, PCR NEGATIVE NEGATIVE   Staphylococcus aureus NEGATIVE NEGATIVE    DG Lumbar Spine 2-3 Views Result Date: 05/09/2024 CLINICAL DATA:  Elective surgery. EXAM: LUMBAR SPINE - 2-3 VIEW COMPARISON:  None Available. FINDINGS: Three fluoroscopic spot views of the lumbar spine submitted from the operating room. Pedicle screws present at L4-L5 with interbody spacer. Fluoroscopy time 34.5 seconds. Dose 17.08 mGy. IMPRESSION: Intraoperative fluoroscopy during lumbar fusion. Electronically Signed   By: Andrea Gasman M.D.   On: 05/09/2024 15:18   DG C-Arm 1-60 Min-No Report Result Date: 05/09/2024 Fluoroscopy was utilized by the requesting physician.  No radiographic interpretation.   DG C-Arm 1-60 Min-No Report Result Date:  05/09/2024 Fluoroscopy was utilized by the requesting physician.  No radiographic interpretation.    Antibiotics:  Anti-infectives (From admission, onward)    Start     Dose/Rate Route Frequency Ordered Stop   05/09/24 1800  ceFAZolin  (ANCEF ) IVPB 2g/100 mL premix       Note to Pharmacy: Renal dose   2 g 200 mL/hr over 30 Minutes Intravenous Every 8 hours 05/09/24 1417 05/10/24 0217   05/09/24 0600  ceFAZolin  (ANCEF ) IVPB 2g/100 mL premix        2 g 200 mL/hr over 30 Minutes Intravenous On call to O.R. 05/09/24 0557 05/09/24 0953       Discharge Exam: Blood pressure 105/73, pulse 75, temperature 98.4 F (36.9 C), temperature source Oral, resp. rate 18, height 5' 3 (1.6 m), weight 61.2 kg, SpO2 99%. Neurologic: Grossly normal Ambulating and voiding well incision cdi   Discharge Medications:   Allergies as of 05/10/2024       Reactions   Morphine  Nausea And Vomiting   Tramadol Nausea And Vomiting, Other (See Comments)   Inability to urinate for many hours   Aspirin  Tinitus   Atovaquone Diarrhea   Celecoxib Other (See Comments)    headache   Esomeprazole Magnesium Other (See Comments)   Headache   Gabapentin Other (See Comments)   Headache   Guaifenesin-codeine Other (See Comments)   GI Upset (intolerance)   Hydrocodone -acetaminophen  Nausea And Vomiting   Malarone [atovaquone-proguanil Hcl] Other (See Comments)   GI Upset (intolerance)   Meloxicam Itching   Pt stated, If I take 15 mg, it upsets stomach, but can take 7.5 mg okay Swollen Hands   Nitrofurantoin Other (See Comments)   Other reaction(s):  muscle tightness in calves,   Oxycodone  Hcl Nausea And Vomiting   Oxycodone -acetaminophen  Other (See Comments)    GI Upset (intolerance)   Proguanil Diarrhea   Sulfamethoxazole-trimethoprim Other (See Comments)   Leg pain   Ciprofloxacin Other (See Comments)   Causes tightness in calf muscles        Medication List     TAKE these medications    B COMPLEX 1  PO Take 1 tablet by mouth daily.   carboxymethylcellulose 0.5 % Soln Commonly known as: REFRESH PLUS Place 1 drop into both ears daily as needed (Dry eye).   CENTRUM MINIS WOMEN 50+ PO Take 2 tablets by mouth daily.   denosumab  60 MG/ML Sosy injection Commonly known as: PROLIA  Inject 60 mg into the skin every 6 (six) months.   estradiol 0.1 MG/GM vaginal cream Commonly known as: ESTRACE Place 2 Applicatorfuls vaginally every 7 (seven) days.   gabapentin 300 MG capsule Commonly known as: NEURONTIN Take 300 mg by mouth at bedtime as needed (pain).   methotrexate  2.5 MG tablet Commonly known as: RHEUMATREX Take 15 mg by mouth once a week.   ondansetron  4 MG tablet Commonly known as: ZOFRAN  Take 1 tablet (4 mg total) by mouth every 6 (six) hours as needed for nausea or vomiting.   oxyCODONE  5 MG immediate release tablet Commonly known as: Oxy IR/ROXICODONE  Take 1-2 tablets (5-10 mg total) by mouth every 4 (four) hours as needed for moderate pain (pain score 4-6) or severe pain (pain score 7-10). What changed: when to take this   Pfizer COVID-19 Vac Bivalent injection Generic drug: COVID-19 mRNA bivalent vaccine (Pfizer) Inject into the muscle.   Pfizer-BioNT COVID-19 Vac-TriS Susp injection Generic drug: COVID-19 mRNA Vac-TriS (Pfizer) Inject into the muscle.   polyethylene glycol 17 g packet Commonly known as: MIRALAX  / GLYCOLAX  Take 17 g by mouth daily as needed for mild constipation or moderate constipation.   Simponi  Aria 50 MG/4ML Soln injection Generic drug: golimumab  Inject 50 mg into the vein every 8 (eight) weeks.   simvastatin  40 MG tablet Commonly known as: ZOCOR  Take 20 mg by mouth daily.   SUMAtriptan  50 MG tablet Commonly known as: IMITREX  Take 50 mg by mouth as needed for migraine.        Disposition: home   Final Dx: PLIF L4-5  Discharge Instructions      Remove dressing in 72 hours   Complete by: As directed    Call MD for:    Complete by: As directed    Call MD for:  difficulty breathing, headache or visual disturbances   Complete by: As directed    Call MD for:  hives   Complete by: As directed    Call MD for:  persistant nausea and vomiting   Complete by: As directed    Call MD for:  redness, tenderness, or signs of infection (pain, swelling, redness, odor or green/yellow discharge around incision site)   Complete by: As directed    Call MD for:  severe uncontrolled pain   Complete by: As directed    Call MD for:  temperature >100.4   Complete by: As directed    Diet - low sodium heart healthy   Complete by: As directed    Driving Restrictions   Complete by: As directed    No driving for 2 weeks, no riding in the car for 1 week   Increase activity slowly   Complete by: As directed    Lifting restrictions  Complete by: As directed    No lifting more than 8 lbs          Signed: Suzen Lacks Alfrieda Tarry 05/10/2024, 7:50 AM

## 2024-05-10 NOTE — Progress Notes (Signed)
 PT Cancellation Note and Discharge  Patient Details Name: Mary Harrell MRN: [id number] DOB: 26-Sep-1950   Cancelled Treatment:    Reason Eval/Treat Not Completed: PT screened, no needs identified, will sign off. Discussed pt case with OT who reports pt is currently mobilizing at a modified independent level and does not require a formal PT evaluation at this time. PT signing off. If needs change, please reconsult.     Leita JONETTA Sable 05/10/2024, 9:33 AM  Leita Sable, PT, DPT Acute Rehabilitation Services Secure Chat Preferred Office: [phone]

## 2024-05-10 NOTE — Progress Notes (Signed)
 Patient stated she's ready to go home and be comfortable in her bed and husband in support. Patient in no acute distress nor complaints of pain nor discomfort; incision on back is clean, dry and intact; No c/o pain at this time. Room was checked and accounted for all patient's belongings; discharge instructions concerning her medications, incision care, follow up appointment and when to call the doctor as needed were all discussed with patient and daughter by RN and they both expressed understanding on the instructions given

## 2024-05-10 NOTE — Progress Notes (Signed)
 Occupational Therapy Evaluation Patient Details Name: Mary Harrell MRN: [id number] DOB: 1951-05-31 Today's Date: 05/10/2024   History of Present Illness   73 yo female s/p 12/1 PLIF L4-5 PMH arthritis, anxiety, CKD, renal cell carcinoma L, depression, GERD, HLD, hypoglycemia, MV prolapse, skin CA removed, ACDF 2015     Clinical Impressions Mary Harrell was evaluated s/p the above spine surgery. She is indep at baseline. Upon evaluation pt was limited by surgical pain, spinal precautions and decreased activity tolerance. Overall she demonstrated mod I ability to complete ADLs and functional mobility with RW for comfort. Provided cues and education on spinal precautions and compensatory techniques throughout, handout provided and pt demonstrated great recall during ADLs and mobility. Pt does not require further acute OT services. Recommend d/c home with support of family.       If plan is discharge home, recommend the following:   Assistance with cooking/housework;Assist for transportation     Functional Status Assessment   Patient has had a recent decline in their functional status and demonstrates the ability to make significant improvements in function in a reasonable and predictable amount of time.     Equipment Recommendations   None recommended by OT      Precautions/Restrictions   Precautions Precautions: Fall;Back Precaution Booklet Issued: Yes (comment) Recall of Precautions/Restrictions: Intact Required Braces or Orthoses: Spinal Brace (orders state no brace needed, LSO in the room.) Spinal Brace: Lumbar corset Restrictions Weight Bearing Restrictions Per Provider Order: No     Mobility Bed Mobility Overal bed mobility: Modified Independent             General bed mobility comments: log roll    Transfers Overall transfer level: Modified independent                 General transfer comment: with RW for comfort, pt is able to complete ADLs without  AD      Balance Overall balance assessment: No apparent balance deficits (not formally assessed)                                         ADL either performed or assessed with clinical judgement   ADL Overall ADL's : Modified independent       General ADL Comments: mod I after review of spinal precautions and compensatory techniques     Vision Baseline Vision/History: 1 Wears glasses Vision Assessment?: No apparent visual deficits     Perception Perception: Within Functional Limits       Praxis Praxis: WFL       Pertinent Vitals/Pain Pain Assessment Pain Assessment: Faces Faces Pain Scale: Hurts a little bit Pain Location: back Pain Descriptors / Indicators: Discomfort, Grimacing, Guarding Pain Intervention(s): Limited activity within patient's tolerance, Monitored during session     Extremity/Trunk Assessment Upper Extremity Assessment Upper Extremity Assessment: Overall WFL for tasks assessed   Lower Extremity Assessment Lower Extremity Assessment: Overall WFL for tasks assessed   Cervical / Trunk Assessment Cervical / Trunk Assessment: Back Surgery   Communication Communication Communication: No apparent difficulties   Cognition Arousal: Alert Behavior During Therapy: WFL for tasks assessed/performed Cognition: No apparent impairments                               Following commands: Intact       Cueing  General Comments  VSS, spouse present           Home Living Family/patient expects to be discharged to:: Private residence Living Arrangements: Spouse/significant other Available Help at Discharge: Family;Available 24 hours/day Type of Home: Apartment Home Access: Level entry;Elevator (3rd floor)     Home Layout: One level     Bathroom Shower/Tub: Producer, Television/film/video: Handicapped height Bathroom Accessibility: Yes   Home Equipment: Agricultural Consultant (2 wheels);Cane - single point;Shower  seat;Grab bars - toilet;Grab bars - tub/shower;Hand held shower head;Adaptive equipment Adaptive Equipment: Reacher        Prior Functioning/Environment Prior Level of Function : Independent/Modified Independent                    OT Problem List: Decreased activity tolerance;Decreased knowledge of precautions        OT Goals(Current goals can be found in the care plan section)   Acute Rehab OT Goals Patient Stated Goal: home OT Goal Formulation: With patient Time For Goal Achievement: 05/24/24 Potential to Achieve Goals: Good   AM-PAC OT 6 Clicks Daily Activity     Outcome Measure Help from another person eating meals?: None Help from another person taking care of personal grooming?: None Help from another person toileting, which includes using toliet, bedpan, or urinal?: None Help from another person bathing (including washing, rinsing, drying)?: None Help from another person to put on and taking off regular upper body clothing?: None Help from another person to put on and taking off regular lower body clothing?: None 6 Click Score: 24   End of Session Equipment Utilized During Treatment: Gait belt;Rolling walker (2 wheels);Back brace Nurse Communication: Mobility status;Patient requests pain meds  Activity Tolerance: Patient tolerated treatment well Patient left: in bed;with call bell/phone within reach;with family/visitor present  OT Visit Diagnosis: Other abnormalities of gait and mobility (R26.89);Muscle weakness (generalized) (M62.81);Pain                Time: 0820-0853 OT Time Calculation (min): 33 min Charges:  OT General Charges $OT Visit: 1 Visit OT Evaluation $OT Eval Moderate Complexity: 1 Mod OT Treatments $Self Care/Home Management : 8-22 mins  Mary Harrell, OTR/L Acute Rehabilitation Services Office [phone] Secure Chat Communication Preferred   Mary Harrell 05/10/2024, 9:40 AM

## 2024-05-12 NOTE — Anesthesia Postprocedure Evaluation (Signed)
 Anesthesia Post Note  Patient: Mary Harrell  Procedure(s) Performed: POSTERIOR LATERAL AND INTERBODY LUMBAR FUSION LUMBAR FOUR-LUMBAR FIVE (Back)     Patient location during evaluation: PACU Anesthesia Type: General Level of consciousness: awake and alert Pain management: pain level controlled Vital Signs Assessment: post-procedure vital signs reviewed and stable Respiratory status: spontaneous breathing, nonlabored ventilation, respiratory function stable and patient connected to nasal cannula oxygen Cardiovascular status: blood pressure returned to baseline and stable Postop Assessment: no apparent nausea or vomiting Anesthetic complications: no   No notable events documented.  Last Vitals:  Vitals:   05/10/24 0435 05/10/24 0736  BP: 105/73 (!) 99/47  Pulse: 75 68  Resp: 18 16  Temp: 36.9 C 36.8 C  SpO2: 99% 99%    Last Pain:  Vitals:   05/10/24 0736  TempSrc: Oral  PainSc:                  Jurni Cesaro S

## 2024-05-14 ENCOUNTER — Emergency Department (HOSPITAL_COMMUNITY)

## 2024-05-14 ENCOUNTER — Other Ambulatory Visit: Payer: Self-pay

## 2024-05-14 ENCOUNTER — Encounter (HOSPITAL_COMMUNITY): Payer: Self-pay

## 2024-05-14 ENCOUNTER — Emergency Department (HOSPITAL_COMMUNITY): Admission: EM | Admit: 2024-05-14 | Discharge: 2024-05-14 | Disposition: A | Discharge status: Home / Self Care

## 2024-05-14 DIAGNOSIS — K5903 Drug induced constipation: Secondary | ICD-10-CM | POA: Diagnosis not present

## 2024-05-14 DIAGNOSIS — R682 Dry mouth, unspecified: Secondary | ICD-10-CM | POA: Insufficient documentation

## 2024-05-14 DIAGNOSIS — R195 Other fecal abnormalities: Secondary | ICD-10-CM | POA: Diagnosis not present

## 2024-05-14 LAB — COMPREHENSIVE METABOLIC PANEL WITH GFR
ALT: 13 U/L (ref 0–44)
AST: 26 U/L (ref 15–41)
Albumin: 3 g/dL — ABNORMAL LOW (ref 3.5–5.0)
Alkaline Phosphatase: 29 U/L — ABNORMAL LOW (ref 38–126)
Anion gap: 9 (ref 5–15)
BUN: 16 mg/dL (ref 8–23)
CO2: 22 mmol/L (ref 22–32)
Calcium: 7.2 mg/dL — ABNORMAL LOW (ref 8.9–10.3)
Chloride: 102 mmol/L (ref 98–111)
Creatinine, Ser: 1.23 mg/dL — ABNORMAL HIGH (ref 0.44–1.00)
GFR, Estimated: 46 mL/min — ABNORMAL LOW (ref >60)
Glucose, Bld: 103 mg/dL — ABNORMAL HIGH (ref 70–99)
Potassium: 3.3 mmol/L — ABNORMAL LOW (ref 3.5–5.1)
Sodium: 133 mmol/L — ABNORMAL LOW (ref 135–145)
Total Bilirubin: 0.6 mg/dL (ref 0.0–1.2)
Total Protein: 6 g/dL — ABNORMAL LOW (ref 6.5–8.1)

## 2024-05-14 LAB — URINALYSIS, ROUTINE W REFLEX MICROSCOPIC
Bilirubin Urine: NEGATIVE
Glucose, UA: NEGATIVE mg/dL
Hgb urine dipstick: NEGATIVE
Ketones, ur: 5 mg/dL — AB
Leukocytes,Ua: NEGATIVE
Nitrite: NEGATIVE
Protein, ur: NEGATIVE mg/dL
Specific Gravity, Urine: 1.006 (ref 1.005–1.030)
pH: 6 (ref 5.0–8.0)

## 2024-05-14 LAB — CBC WITH DIFFERENTIAL/PLATELET
Abs Immature Granulocytes: 0.01 K/uL (ref 0.00–0.07)
Basophils Absolute: 0 K/uL (ref 0.0–0.1)
Basophils Relative: 1 %
Eosinophils Absolute: 0.2 K/uL (ref 0.0–0.5)
Eosinophils Relative: 3 %
HCT: 27.4 % — ABNORMAL LOW (ref 36.0–46.0)
Hemoglobin: 9.2 g/dL — ABNORMAL LOW (ref 12.0–15.0)
Immature Granulocytes: 0 %
Lymphocytes Relative: 13 %
Lymphs Abs: 1 K/uL (ref 0.7–4.0)
MCH: 32.1 pg (ref 26.0–34.0)
MCHC: 33.6 g/dL (ref 30.0–36.0)
MCV: 95.5 fL (ref 80.0–100.0)
Monocytes Absolute: 1 K/uL (ref 0.1–1.0)
Monocytes Relative: 13 %
Neutro Abs: 5.5 K/uL (ref 1.7–7.7)
Neutrophils Relative %: 70 %
Platelets: 165 K/uL (ref 150–400)
RBC: 2.87 MIL/uL — ABNORMAL LOW (ref 3.87–5.11)
RDW: 12.2 % (ref 11.5–15.5)
WBC: 7.7 K/uL (ref 4.0–10.5)
nRBC: 0 % (ref 0.0–0.2)

## 2024-05-14 MED ORDER — FAMOTIDINE 20 MG PO TABS: 20.0000 mg | ORAL_TABLET | Freq: Two times a day (BID) | ORAL | 0 refills | Status: AC

## 2024-05-14 MED ORDER — SODIUM CHLORIDE 0.9 % IV BOLUS
500.0000 mL | Freq: Once | INTRAVENOUS | Status: AC
  Administered 2024-05-14: 500 mL via INTRAVENOUS

## 2024-05-14 MED ORDER — NYSTATIN 100000 UNIT/ML MT SUSP: 500000.0000 [IU] | Freq: Four times a day (QID) | OROMUCOSAL | 0 refills | Status: AC

## 2024-05-14 MED ORDER — POTASSIUM CHLORIDE CRYS ER 20 MEQ PO TBCR
30.0000 meq | EXTENDED_RELEASE_TABLET | Freq: Once | ORAL | Status: AC
  Administered 2024-05-14: 30 meq via ORAL
  Filled 2024-05-14: qty 1

## 2024-05-14 NOTE — Discharge Instructions (Addendum)
 Please stop the oxycodone  to see if this helps with your dry mouth. I have also called in nystatin  for you to see if this helps.   Take the miralax  daily until you have a bowel movement.   If you have any trouble swallowing or breathing please come back to the ED immediately.   If this does not get better please follow up with ENT. Sometimes after intubation you can get irritation/injury that can cause these symptoms.   Your hemoglobin was a little lower

## 2024-05-14 NOTE — ED Triage Notes (Signed)
 Pt states she had lumbar fusion on Monday and is now having lip swelling and white bumps on tongue. Pt reports difficulty swelling due to swelling/dryness. Pt able to talk in complete sentences with no distress in triage. [email or web address]

## 2024-05-14 NOTE — ED Provider Notes (Signed)
 [place] EMERGENCY DEPARTMENT AT [facility] Provider Note   CSN: [id number] Arrival date & time: 05/14/24  9253     Patient presents with: No chief complaint on file.   Mary Harrell is a 72 y.o. female.   HPI   Patient presents because of chief complaint of dry mouth.  Patient states that she has been having dry mouth.  Has some soreness of her tongue as well over the past couple days.  Noticed some white spots on her tongue.  She also noted very minimal isolated swelling to the right upper lip.  Patient states that with the dry mouth, she feels like it is harder to tolerate food.  She is not feel like it gets stuck but just feels like is having hard time going down than usual.  Patient states the only thing that has changed since she was here in the hospital of her fusion is now that she is taking oxycodone .  No other new medication.  Patient also states that she had noticed a color change in her urine a couple days ago.  No obvious UTI symptoms.  Denies any chest pain or shortness of breath.  No exertional chest pain or pleuritic chest pain or hemoptysis.  Also endorsing constipation.  Last had a bowel movement about a week ago.  Took MiraLAX  2 days ago.  Passing some flatulence.  Had a nephrectomy in the setting of cancer in the past otherwise no other abdominal surgeries  Previous medical history reviewed : Patient was discharged on May 10, 2024. PLIF L4-5.    Prior to Admission medications   Medication Sig Start Date End Date Taking? Authorizing Provider  famotidine  (PEPCID ) 20 MG tablet Take 1 tablet (20 mg total) by mouth 2 (two) times daily. 05/14/24  Yes Simon Lavonia SAILOR, MD  nystatin  (MYCOSTATIN ) 100000 UNIT/ML suspension Take 5 mLs (500,000 Units total) by mouth 4 (four) times daily. 05/14/24  Yes Simon Lavonia SAILOR, MD  B Complex Vitamins (B COMPLEX 1 PO) Take 1 tablet by mouth daily.    [provider]  carboxymethylcellulose (REFRESH PLUS) 0.5 % SOLN Place 1  drop into both ears daily as needed (Dry eye).    [provider]  denosumab  (PROLIA ) 60 MG/ML SOSY injection Inject 60 mg into the skin every 6 (six) months.    [provider]  estradiol  (ESTRACE ) 0.1 MG/GM vaginal cream Place 2 Applicatorfuls vaginally every 7 (seven) days. 04/19/19   [provider]  gabapentin  (NEURONTIN ) 300 MG capsule Take 300 mg by mouth at bedtime as needed (pain). 01/07/24   [provider]  golimumab  (SIMPONI  ARIA) 50 MG/4ML SOLN injection Inject 50 mg into the vein every 8 (eight) weeks.    [provider]  methotrexate  (RHEUMATREX) 2.5 MG tablet Take 15 mg by mouth once a week. Patient not taking: Reported on 04/20/2024 12/24/21   [provider]  Multiple Vitamins-Minerals (CENTRUM MINIS WOMEN 50+ PO) Take 2 tablets by mouth daily.    [provider]  ondansetron  (ZOFRAN ) 4 MG tablet Take 1 tablet (4 mg total) by mouth every 6 (six) hours as needed for nausea or vomiting. 05/10/24   Meyran, Suzen Lacks, NP  oxyCODONE  (OXY IR/ROXICODONE ) 5 MG immediate release tablet Take 1-2 tablets (5-10 mg total) by mouth every 4 (four) hours as needed for moderate pain (pain score 4-6) or severe pain (pain score 7-10). 05/10/24   Meyran, Suzen Lacks, NP  polyethylene glycol (MIRALAX  / GLYCOLAX ) 17 g  packet Take 17 g by mouth daily as needed for mild constipation or moderate constipation.    [provider]  simvastatin  (ZOCOR ) 40 MG tablet Take 20 mg by mouth daily. 04/27/14   [provider]  SUMAtriptan  (IMITREX ) 50 MG tablet Take 50 mg by mouth as needed for migraine. 04/27/14   [provider]    Allergies: Morphine , Tramadol, Aspirin , Atovaquone, Celecoxib, Esomeprazole magnesium, Gabapentin , Guaifenesin-codeine, Hydrocodone -acetaminophen , Malarone [atovaquone-proguanil hcl], Meloxicam, Nitrofurantoin, Oxycodone  hcl, Oxycodone -acetaminophen , Proguanil, Sulfamethoxazole-trimethoprim, and  Ciprofloxacin    Review of Systems  Constitutional:  Negative for chills and fever.  HENT:  Negative for ear pain and sore throat.   Eyes:  Negative for pain and visual disturbance.  Respiratory:  Negative for cough and shortness of breath.   Cardiovascular:  Negative for chest pain and palpitations.  Gastrointestinal:  Negative for abdominal pain and vomiting.  Genitourinary:  Negative for dysuria and hematuria.  Musculoskeletal:  Negative for arthralgias and back pain.  Skin:  Negative for color change and rash.  Neurological:  Negative for seizures and syncope.  All other systems reviewed and are negative.   Updated Vital Signs BP 124/70 (BP Location: Right Arm)   Pulse 79   Temp 98.5 F (36.9 C) (Oral)   Resp 17   Ht 5' 3 (1.6 m)   Wt 61.2 kg   SpO2 100%   BMI 23.91 kg/m   Physical Exam Vitals and nursing note reviewed.  Constitutional:      General: She is not in acute distress.    Appearance: She is well-developed.  HENT:     Head: Normocephalic and atraumatic.  Eyes:     Conjunctiva/sclera: Conjunctivae normal.  Cardiovascular:     Rate and Rhythm: Normal rate and regular rhythm.     Heart sounds: No murmur heard. Pulmonary:     Effort: Pulmonary effort is normal. No respiratory distress.     Breath sounds: Normal breath sounds.  Abdominal:     Palpations: Abdomen is soft.     Tenderness: There is no abdominal tenderness.  Musculoskeletal:        General: No swelling.     Cervical back: Neck supple.  Skin:    General: Skin is warm and dry.     Capillary Refill: Capillary refill takes less than 2 seconds.  Neurological:     Mental Status: She is alert.  Psychiatric:        Mood and Affect: Mood normal.     (all labs ordered are listed, but only abnormal results are displayed) Labs Reviewed  CBC WITH DIFFERENTIAL/PLATELET - Abnormal; Notable for the following components:      Result Value   RBC 2.87 (*)    Hemoglobin 9.2 (*)    HCT 27.4 (*)     All other components within normal limits  COMPREHENSIVE METABOLIC PANEL WITH GFR - Abnormal; Notable for the following components:   Sodium 133 (*)    Potassium 3.3 (*)    Glucose, Bld 103 (*)    Creatinine, Ser 1.23 (*)    Calcium 7.2 (*)    Total Protein 6.0 (*)    Albumin  3.0 (*)    Alkaline Phosphatase 29 (*)    GFR, Estimated 46 (*)    All other components within normal limits  URINALYSIS, ROUTINE W REFLEX MICROSCOPIC - Abnormal; Notable for the following components:   APPearance HAZY (*)    Ketones, ur 5 (*)    All other components within normal limits  EKG: None  Radiology: DG Abd Portable 1 View Result Date: 05/14/2024 EXAM: 1 VIEW XRAY OF THE ABDOMEN 05/14/2024 08:20:00 AM COMPARISON: None available. CLINICAL HISTORY: recent lumbar fusion. No BM in week. Likely because on oxy but want to help eval bowel/gas pattern. Soft abd and reassuring exam FINDINGS: BOWEL: Nonobstructive bowel gas pattern. Moderate colonic stool burden diffusely throughout the colon. SOFT TISSUES: Vascular calcifications. BONES: L4-L5 spinal fusion hardware in place. 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 No acute fracture. IMPRESSION: 1. Moderate colonic stool burden diffusely throughout the colon. Electronically signed by: Waddell Calk MD 05/14/2024 08:42 AM EST RP Workstation: [id number]     Procedures   Medications Ordered in the ED  sodium chloride  0.9 % bolus 500 mL (0 mLs Intravenous Stopped 05/14/24 1033)  potassium chloride  (KLOR-CON  M) CR tablet 30 mEq (30 mEq Oral Given 05/14/24 1247)                                    Medical Decision Making Amount and/or Complexity of Data Reviewed Labs: ordered. Radiology: ordered.  Risk Prescription drug management.     HPI:   Patient presents because of chief complaint of dry mouth.  Patient states that she has been having dry mouth.  Has some soreness of her tongue as well over the past couple days.  Noticed some white spots on  her tongue.  She also noted very minimal isolated swelling to the right upper lip.  Patient states that with the dry mouth, she feels like it is harder to tolerate food.  She is not feel like it gets stuck but just feels like is having hard time going down than usual.  Patient states the only thing that has changed since she was here in the hospital of her fusion is now that she is taking oxycodone .  No other new medication.  Patient also states that she had noticed a color change in her urine a couple days ago.  No obvious UTI symptoms.  Denies any chest pain or shortness of breath.  No exertional chest pain or pleuritic chest pain or hemoptysis.  Also endorsing constipation.  Last had a bowel movement about a week ago.  Took MiraLAX  2 days ago.  Passing some flatulence.  Had a nephrectomy in the setting of cancer in the past otherwise no other abdominal surgeries  Previous medical history reviewed : Patient was discharged on May 10, 2024. PLIF L4-5.   MDM:   Upon exam, patient ANO x 3 with GCS 15.  No focal deficits  In terms of her oral exam.  Minimal isolated swelling to the right upper lip.  Unclear etiology at this point in time.  No oropharynx swelling.  She has a widely patent and open airway with a midline uvula as well as no swelling or erythema of the posterior oropharynx.  She does have some dry mucous membranes on exam.  Question whether or not the oxycodone  could be causing the dryness sensation.  In terms of the minimal right upper lip swelling, unclear.  Question whether or not could be related to the dry mucous membranes as well.  No pain to palpation over this area.  No other swelling.  No other signs or symptoms of any kind of anaphylaxis or allergic reaction.  Only medication change has been made is the oxycodone .  Will watch the ED and observe symptoms  Will obtain abdominal x-ray.  Lack of bowel movement over the past week.  Likely because of pain medication, lack of mobility in  the setting of recent surgery.  She has a soft and benign abdomen.  Likely constipation because of pain medication as well as low mobility.  No indication for emergent CT scan.  Will obtain basic laboratory workup as well.  Obtain UA given urine color change  Reevaluation:   Upon reexamination, patient hemodynamically stable.  Remains A&O x 3 with GCS 15.  Patient is tolerating p.o.  Laboratory workup showed mild hypokalemia.  Replaced orally.  Hemoglobin has dropped compared to preop.  No evidence of ongoing bleeding this at time.  No evidence of GI bleed  Question whether or not she could be does have a dry mucous membranes in the setting of oxycodone  use.  Counseled against further use of oxycodone .  Recommended Tylenol .  See the cells.  She also states that she had a follow-up with ENT because something similar during last intubation.  I did give her referral to ENT if this continues.  No concern for any kind of airway swelling.  No stridor.  Tolerating p.o.  Speaking in complete sentences.  No concerns of any kind of retropharyngeal abscess or other acute infection  I did give her nystatin  swish as well given some irritation on the tongue.  Patient states that she did receive antibiotics while in the hospital.  Reasonable to try  Otherwise, patient be discharged stable condition  Social Determinant of Health: lives at home with husband    Disposition and Follow Up: ent       Final diagnoses:  Drug-induced constipation  Dry mouth    ED Discharge Orders          Ordered    famotidine  (PEPCID ) 20 MG tablet  2 times daily        05/14/24 1238    nystatin  (MYCOSTATIN ) 100000 UNIT/ML suspension  4 times daily        05/14/24 1239               Simon Lavonia SAILOR, MD 05/14/24 1339
